# Patient Record
Sex: Female | Born: 1975
Health system: Southern US, Community
[De-identification: ages and names within clinical notes are randomized; demographics above are authoritative.]

## PROBLEM LIST (undated history)

## (undated) DIAGNOSIS — F419 Anxiety disorder, unspecified: Secondary | ICD-10-CM

## (undated) DIAGNOSIS — E785 Hyperlipidemia, unspecified: Secondary | ICD-10-CM

## (undated) DIAGNOSIS — E119 Type 2 diabetes mellitus without complications: Secondary | ICD-10-CM

## (undated) DIAGNOSIS — F32A Depression, unspecified: Secondary | ICD-10-CM

## (undated) DIAGNOSIS — F329 Major depressive disorder, single episode, unspecified: Secondary | ICD-10-CM

## (undated) HISTORY — DX: Hyperlipidemia, unspecified: E78.5

## (undated) HISTORY — DX: Anxiety disorder, unspecified: F41.9

## (undated) HISTORY — PX: ABDOMINAL HYSTERECTOMY: SHX81

## (undated) HISTORY — DX: Depression, unspecified: F32.A

## (undated) HISTORY — PX: CHOLECYSTECTOMY: SHX55

## (undated) HISTORY — DX: Type 2 diabetes mellitus without complications: E11.9

---
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
## 2016-11-06 DIAGNOSIS — F331 Major depressive disorder, recurrent, moderate: Secondary | ICD-10-CM | POA: Insufficient documentation

## 2016-11-06 DIAGNOSIS — E1165 Type 2 diabetes mellitus with hyperglycemia: Secondary | ICD-10-CM | POA: Insufficient documentation

## 2016-11-06 DIAGNOSIS — Z87442 Personal history of urinary calculi: Secondary | ICD-10-CM | POA: Insufficient documentation

## 2018-01-15 DIAGNOSIS — A630 Anogenital (venereal) warts: Secondary | ICD-10-CM | POA: Insufficient documentation

## 2018-04-10 ENCOUNTER — Ambulatory Visit (INDEPENDENT_AMBULATORY_CARE_PROVIDER_SITE_OTHER): Payer: BLUE CROSS/BLUE SHIELD | Admitting: Family Medicine

## 2018-04-10 ENCOUNTER — Other Ambulatory Visit (HOSPITAL_COMMUNITY)
Admission: RE | Admit: 2018-04-10 | Discharge: 2018-04-10 | Disposition: A | Discharge status: Home / Self Care | Payer: BLUE CROSS/BLUE SHIELD | Source: Ambulatory Visit | Attending: Family Medicine | Admitting: Family Medicine

## 2018-04-10 ENCOUNTER — Encounter: Payer: Self-pay | Admitting: Family Medicine

## 2018-04-10 VITALS — BP 109/75 | HR 73 | Temp 98.6°F | Resp 17 | Ht 62.0 in | Wt 180.2 lb

## 2018-04-10 DIAGNOSIS — N898 Other specified noninflammatory disorders of vagina: Secondary | ICD-10-CM | POA: Diagnosis not present

## 2018-04-10 DIAGNOSIS — B9689 Other specified bacterial agents as the cause of diseases classified elsewhere: Secondary | ICD-10-CM | POA: Diagnosis not present

## 2018-04-10 DIAGNOSIS — Z6833 Body mass index (BMI) 33.0-33.9, adult: Secondary | ICD-10-CM

## 2018-04-10 DIAGNOSIS — N760 Acute vaginitis: Secondary | ICD-10-CM | POA: Insufficient documentation

## 2018-04-10 DIAGNOSIS — Z8639 Personal history of other endocrine, nutritional and metabolic disease: Secondary | ICD-10-CM | POA: Diagnosis not present

## 2018-04-10 MED ORDER — FLUCONAZOLE 150 MG PO TABS: 150.0000 mg | ORAL_TABLET | Freq: Once | ORAL | 1 refills | Status: AC

## 2018-04-10 MED ORDER — METRONIDAZOLE 500 MG PO TABS: 500.0000 mg | ORAL_TABLET | Freq: Two times a day (BID) | ORAL | 0 refills | Status: AC

## 2018-04-10 MED FILL — metroNIDAZOLE 500 MG TABS: 500 | 7 days supply | Qty: 14 | Fill #0

## 2018-04-10 MED FILL — FLUCONAZOLE 150 MG TABS: 150 | 2 days supply | Qty: 2 | Fill #0

## 2018-04-10 NOTE — Progress Notes (Signed)
Mary Howard, is a 42 y.o. female  WGN:[id number]  HQI:[id number]  DOB - 06-08-1976  CC:  Chief Complaint  Patient presents with  . Establish Care  . Vaginitis    is diabetic. was on Metformin & Glipizide but was able to d/c & be diet controlled. has noticed over the last 2 years that she's having them more frequently. 3-4 weeks ago she has noticed itching, odor, milky white discharge & irritation       HPI: Seeley is a 42 y.o. female is here today to establish care.   Chronic health problems include:has Hx of diabetes mellitus and BMI 33.0-33.9,adult on their problem list.   Yazlin Ekblad reports a history of type 2 diabetes with oral medication management. Oral diabetes were stopped about 10 months ago as her diabetes was well controlled and she lost a significant amount of weight. Denies polyuria, polydipsia, polyphagia, numbness/tingling of distal extremities. Her concern today is recurrent episodes of vaginitis and yeast infections. He was told recurrences were related to diabetes, although once diabetes improved, vaginal issues continue. She notes vaginal irritation often coincides with her menstrual period. Current symptoms include odor, thick discharge, and white speckle type shapes in discharge. Recently diagnosed with HPV and had once occurrence of vaginal warts. Last unprotected sexual encounter occurred around 3 months ago. Patient denies new headaches, chest pain, abdominal pain, nausea, new weakness , numbness or tingling, SOB, edema, or worrisome cough. .  Current medications: Current Outpatient Medications:  .  metroNIDAZOLE (FLAGYL) 500 MG tablet, Take 1 tablet (500 mg total) by mouth 2 (two) times daily for 7 days., Disp: 14 tablet, Rfl: 0   Pertinent family medical history: family history includes Bipolar disorder in her mother and sister; Cancer in her paternal grandfather, paternal grandmother, and paternal uncle; Diabetes in her mother; Heart disease in her father;  Hyperlipidemia in her father; Hypertension in her mother; Stroke in her father.   No Known Allergies  Social History   Socioeconomic History  . Marital status: Divorced    Spouse name: Not on file  . Number of children: Not on file  . Years of education: Not on file  . Highest education level: Not on file  Occupational History  . Not on file  Social Needs  . Financial resource strain: Not on file  . Food insecurity:    Worry: Not on file    Inability: Not on file  . Transportation needs:    Medical: Not on file    Non-medical: Not on file  Tobacco Use  . Smoking status: Light Tobacco Smoker  . Smokeless tobacco: Never Used  Substance and Sexual Activity  . Alcohol use: Not on file  . Drug use: Not on file  . Sexual activity: Not on file  Lifestyle  . Physical activity:    Days per week: Not on file    Minutes per session: Not on file  . Stress: Not on file  Relationships  . Social connections:    Talks on phone: Not on file    Gets together: Not on file    Attends religious service: Not on file    Active member of club or organization: Not on file    Attends meetings of clubs or organizations: Not on file    Relationship status: Not on file  . Intimate partner violence:    Fear of current or ex partner: Not on file    Emotionally abused: Not on file    Physically abused: Not  on file    Forced sexual activity: Not on file  Other Topics Concern  . Not on file  Social History Narrative  . Not on file   Review of Systems  Constitutional: Negative.   Respiratory: Negative.   Cardiovascular: Negative.   Genitourinary:       Vaginal irritation   Skin: Negative.   Psychiatric/Behavioral: Negative.        Objective:   Vitals:   04/10/18 1003  BP: 109/75  Pulse: 73  Resp: 17  Temp: 98.6 F (37 C)  SpO2: 97%    BP Readings from Last 3 Encounters:  04/10/18 109/75    Filed Weights   04/10/18 1003  Weight: 180 lb 3.2 oz (81.7 kg)      Physical  Exam: Constitutional: Patient appears well-developed and well-nourished. No distress. HENT: Normocephalic, atraumatic, External right and left ear normal.  Eyes: Conjunctivae and EOM are normal. PERRLA, no scleral icterus. Neck: Normal ROM. Neck supple. No JVD. No tracheal deviation. No thyromegaly. CVS: RRR, S1/S2 +, no murmurs, no gallops, no carotid bruit.  Pulmonary: Effort and breath sounds normal, no stridor, rhonchi, wheezes, rales.  Abdominal: Soft. BS +, no distension, tenderness, rebound or guarding.  Musculoskeletal: Normal range of motion. No edema and no tenderness.  Female Genitalia: Normal female external genitalia without lesion. No inguinal lymphadenopathy. Vaginal mucosa is pink and moist without lesions.  Mild odor noted.  Opaque discharge noted.  Vaginal specimen collected. Chaperone present throughtout exam.  Neuro: Alert. Normal reflexes, muscle tone coordination. No cranial nerve deficit. Skin: Skin is warm and dry. No rash noted. Not diaphoretic. No erythema. No pallor. Psychiatric: Normal mood and affect. Behavior, judgment, thought content normal.    Assessment and plan:  1. Vaginal irritation 2. Vaginal odor Vaginal specimen collected.  Genitalia intact.  Negative for satellite lesions.  Exam findings with history is most significant for bacterial vaginosis.  Will treat for both bacterial vaginosis along with Diflucan for yeast, will awaiting lab results. - Cervicovaginal ancillary only -CBC to rule out infection  3. Hx of diabetes mellitus, Obtaining a baseline hemoglobin A1c and fasting CMP  4. BMI 33.0-33.9,adult, will check a thyroid panel to rule out metabolic causes.   Checking a lipid panel to obtain a baseline.   Return in about 1 month (around 05/11/2018) for review labs and , Complete Physical Exam.  The patient was given clear instructions to go to ER or return to medical center if symptoms don't improve, worsen or new problems develop. The patient  verbalized understanding. The patient was told to call to get lab results if they haven't heard anything in the next week.    Joaquin Courts, FNP Primary Care at [facility] [address], [place] [address] [phone]: [phone]    This note has been created with Dragon speech recognition software and Paediatric nurse. Any transcriptional errors are unintentional.

## 2018-04-10 NOTE — Patient Instructions (Addendum)
Review medication list attached and take all medications as prescribed today.  To have labs completed go: [facility]  [address] [place], [place] [address] [phone]  Thank you for choosing Primary Care at [facility] for your medical home!    Mary Howard was seen by Joaquin Courts, FNP today.   Mary Howard's primary care doctor is Bing Neighbors, FNP.   For the best care possible,  you should try to see Joaquin Courts, FNP  whenever you come to clinic.   We look forward to seeing you again soon!  If you have any questions about your visit today,  please call us at   Or feel free to reach your provider via MyChart.

## 2018-04-11 DIAGNOSIS — Z6833 Body mass index (BMI) 33.0-33.9, adult: Secondary | ICD-10-CM | POA: Insufficient documentation

## 2018-04-11 DIAGNOSIS — Z8639 Personal history of other endocrine, nutritional and metabolic disease: Secondary | ICD-10-CM | POA: Insufficient documentation

## 2018-04-11 LAB — CERVICOVAGINAL ANCILLARY ONLY
BACTERIAL VAGINITIS: POSITIVE — AB
Candida vaginitis: NEGATIVE
Chlamydia: NEGATIVE
NEISSERIA GONORRHEA: NEGATIVE
TRICH (WINDOWPATH): NEGATIVE

## 2018-04-14 ENCOUNTER — Telehealth: Payer: Self-pay | Admitting: Family Medicine

## 2018-04-14 NOTE — Telephone Encounter (Signed)
Patient called requesting lab results, please follow up with patient.  °

## 2018-04-14 NOTE — Progress Notes (Signed)
Patient notified of results. Expressed understanding.

## 2018-04-14 NOTE — Telephone Encounter (Signed)
Patient notified of lab results

## 2018-05-05 ENCOUNTER — Telehealth: Payer: Self-pay | Admitting: Family Medicine

## 2018-05-05 NOTE — Telephone Encounter (Signed)
Advise patient that she can take the Diflucan today and at her next follow-up I can recheck her with a wet prep to ensure discharge is negative for infection. If symptoms worsen prior to that time, feel to call in for an appointment to be evaluated.

## 2018-05-05 NOTE — Telephone Encounter (Signed)
Patient came in to the facility requesting for her PCP to give her a call regarding her upcoming appointment. Please f/u

## 2018-05-05 NOTE — Telephone Encounter (Signed)
Voicemail full.

## 2018-05-05 NOTE — Telephone Encounter (Signed)
States that she has completed the Flagyl. Says that the odor has mostly resolved but she still has a faint odor. Her discharge has gotten much better since her appointment but she still has some milky discharge. She didn't take the Diflucan.  Please advise.

## 2018-05-12 ENCOUNTER — Other Ambulatory Visit (HOSPITAL_COMMUNITY)
Admission: RE | Admit: 2018-05-12 | Discharge: 2018-05-12 | Disposition: A | Discharge status: Home / Self Care | Payer: BLUE CROSS/BLUE SHIELD | Source: Ambulatory Visit | Attending: Family Medicine | Admitting: Family Medicine

## 2018-05-12 ENCOUNTER — Ambulatory Visit: Payer: BLUE CROSS/BLUE SHIELD | Admitting: Family Medicine

## 2018-05-12 ENCOUNTER — Encounter: Payer: Self-pay | Admitting: Family Medicine

## 2018-05-12 VITALS — BP 129/87 | HR 89 | Temp 99.8°F | Resp 18 | Ht 62.0 in | Wt 181.0 lb

## 2018-05-12 DIAGNOSIS — Z8639 Personal history of other endocrine, nutritional and metabolic disease: Secondary | ICD-10-CM | POA: Diagnosis not present

## 2018-05-12 DIAGNOSIS — Z1239 Encounter for other screening for malignant neoplasm of breast: Secondary | ICD-10-CM | POA: Diagnosis not present

## 2018-05-12 DIAGNOSIS — Z6833 Body mass index (BMI) 33.0-33.9, adult: Secondary | ICD-10-CM

## 2018-05-12 DIAGNOSIS — N898 Other specified noninflammatory disorders of vagina: Secondary | ICD-10-CM

## 2018-05-12 DIAGNOSIS — Z124 Encounter for screening for malignant neoplasm of cervix: Secondary | ICD-10-CM

## 2018-05-12 DIAGNOSIS — Z0000 Encounter for general adult medical examination without abnormal findings: Secondary | ICD-10-CM

## 2018-05-12 NOTE — Patient Instructions (Addendum)
To schedule your breast exam, please call [facility] at  Phone: [phone]   Health Maintenance, Female Adopting a healthy lifestyle and getting preventive care can go a long way to promote health and wellness. Talk with your health care provider about what schedule of regular examinations is right for you. This is a good chance for you to check in with your provider about disease prevention and staying healthy. In between checkups, there are plenty of things you can do on your own. Experts have done a lot of research about which lifestyle changes and preventive measures are most likely to keep you healthy. Ask your health care provider for more information. Weight and diet Eat a healthy diet  Be sure to include plenty of vegetables, fruits, low-fat dairy products, and lean protein.  Do not eat a lot of foods high in solid fats, added sugars, or salt.  Get regular exercise. This is one of the most important things you can do for your health. ? Most adults should exercise for at least 150 minutes each week. The exercise should increase your heart rate and make you sweat (moderate-intensity exercise). ? Most adults should also do strengthening exercises at least twice a week. This is in addition to the moderate-intensity exercise.  Maintain a healthy weight  Body mass index (BMI) is a measurement that can be used to identify possible weight problems. It estimates body fat based on height and weight. Your health care provider can help determine your BMI and help you achieve or maintain a healthy weight.  For females 64 years of age and older: ? A BMI below 18.5 is considered underweight. ? A BMI of 18.5 to 24.9 is normal. ? A BMI of 25 to 29.9 is considered overweight. ? A BMI of 30 and above is considered obese.  Watch levels of cholesterol and blood lipids  You should start having your blood tested for lipids and cholesterol at 40 years of age, then have this test every 5  years.  You may need to have your cholesterol levels checked more often if: ? Your lipid or cholesterol levels are high. ? You are older than 40 years of age. ? You are at high risk for heart disease.  Cancer screening Lung Cancer  Lung cancer screening is recommended for adults 9-77 years old who are at high risk for lung cancer because of a history of smoking.  A yearly low-dose CT scan of the lungs is recommended for people who: ? Currently smoke. ? Have quit within the past 15 years. ? Have at least a 30-pack-year history of smoking. A pack year is smoking an average of one pack of cigarettes a day for 1 year.  Yearly screening should continue until it has been 15 years since you quit.  Yearly screening should stop if you develop a health problem that would prevent you from having lung cancer treatment.  Breast Cancer  Practice breast self-awareness. This means understanding how your breasts normally appear and feel.  It also means doing regular breast self-exams. Let your health care provider know about any changes, no matter how small.  If you are in your 20s or 30s, you should have a clinical breast exam (CBE) by a health care provider every 1-3 years as part of a regular health exam.  If you are 41 or older, have a CBE every year. Also consider having a breast X-ray (mammogram) every year.  If you have a family history of breast cancer,  talk to your health care provider about genetic screening.  If you are at high risk for breast cancer, talk to your health care provider about having an MRI and a mammogram every year.  Breast cancer gene (BRCA) assessment is recommended for women who have family members with BRCA-related cancers. BRCA-related cancers include: ? Breast. ? Ovarian. ? Tubal. ? Peritoneal cancers.  Results of the assessment will determine the need for genetic counseling and BRCA1 and BRCA2 testing.  Cervical Cancer Your health care provider may  recommend that you be screened regularly for cancer of the pelvic organs (ovaries, uterus, and vagina). This screening involves a pelvic examination, including checking for microscopic changes to the surface of your cervix (Pap test). You may be encouraged to have this screening done every 3 years, beginning at age 43.  For women ages 5-65, health care providers may recommend pelvic exams and Pap testing every 3 years, or they may recommend the Pap and pelvic exam, combined with testing for human papilloma virus (HPV), every 5 years. Some types of HPV increase your risk of cervical cancer. Testing for HPV may also be done on women of any age with unclear Pap test results.  Other health care providers may not recommend any screening for nonpregnant women who are considered low risk for pelvic cancer and who do not have symptoms. Ask your health care provider if a screening pelvic exam is right for you.  If you have had past treatment for cervical cancer or a condition that could lead to cancer, you need Pap tests and screening for cancer for at least 20 years after your treatment. If Pap tests have been discontinued, your risk factors (such as having a new sexual partner) need to be reassessed to determine if screening should resume. Some women have medical problems that increase the chance of getting cervical cancer. In these cases, your health care provider may recommend more frequent screening and Pap tests.  Colorectal Cancer  This type of cancer can be detected and often prevented.  Routine colorectal cancer screening usually begins at 40 years of age and continues through 40 years of age.  Your health care provider may recommend screening at an earlier age if you have risk factors for colon cancer.  Your health care provider may also recommend using home test kits to check for hidden blood in the stool.  A small camera at the end of a tube can be used to examine your colon directly  (sigmoidoscopy or colonoscopy). This is done to check for the earliest forms of colorectal cancer.  Routine screening usually begins at age 61.  Direct examination of the colon should be repeated every 5-10 years through 40 years of age. However, you may need to be screened more often if early forms of precancerous polyps or small growths are found.  Skin Cancer  Check your skin from head to toe regularly.  Tell your health care provider about any new moles or changes in moles, especially if there is a change in a mole's shape or color.  Also tell your health care provider if you have a mole that is larger than the size of a pencil eraser.  Always use sunscreen. Apply sunscreen liberally and repeatedly throughout the day.  Protect yourself by wearing long sleeves, pants, a wide-brimmed hat, and sunglasses whenever you are outside.  Heart disease, diabetes, and high blood pressure  High blood pressure causes heart disease and increases the risk of stroke. High blood pressure is  more likely to develop in: ? People who have blood pressure in the high end of the normal range (130-139/85-89 mm Hg). ? People who are overweight or obese. ? People who are African American.  If you are 1-44 years of age, have your blood pressure checked every 3-5 years. If you are 35 years of age or older, have your blood pressure checked every year. You should have your blood pressure measured twice-once when you are at a hospital or clinic, and once when you are not at a hospital or clinic. Record the average of the two measurements. To check your blood pressure when you are not at a hospital or clinic, you can use: ? An automated blood pressure machine at a pharmacy. ? A home blood pressure monitor.  If you are between 71 years and 69 years old, ask your health care provider if you should take aspirin to prevent strokes.  Have regular diabetes screenings. This involves taking a blood sample to check your  fasting blood sugar level. ? If you are at a normal weight and have a low risk for diabetes, have this test once every three years after 40 years of age. ? If you are overweight and have a high risk for diabetes, consider being tested at a younger age or more often. Preventing infection Hepatitis B  If you have a higher risk for hepatitis B, you should be screened for this virus. You are considered at high risk for hepatitis B if: ? You were born in a country where hepatitis B is common. Ask your health care provider which countries are considered high risk. ? Your parents were born in a high-risk country, and you have not been immunized against hepatitis B (hepatitis B vaccine). ? You have HIV or AIDS. ? You use needles to inject street drugs. ? You live with someone who has hepatitis B. ? You have had sex with someone who has hepatitis B. ? You get hemodialysis treatment. ? You take certain medicines for conditions, including cancer, organ transplantation, and autoimmune conditions.  Hepatitis C  Blood testing is recommended for: ? Everyone born from [birth year] through 1963. ? Anyone with known risk factors for hepatitis C.  Sexually transmitted infections (STIs)  You should be screened for sexually transmitted infections (STIs) including gonorrhea and chlamydia if: ? You are sexually active and are younger than 40 years of age. ? You are older than 40 years of age and your health care provider tells you that you are at risk for this type of infection. ? Your sexual activity has changed since you were last screened and you are at an increased risk for chlamydia or gonorrhea. Ask your health care provider if you are at risk.  If you do not have HIV, but are at risk, it may be recommended that you take a prescription medicine daily to prevent HIV infection. This is called pre-exposure prophylaxis (PrEP). You are considered at risk if: ? You are sexually active and do not regularly use condoms  or know the HIV status of your partner(s). ? You take drugs by injection. ? You are sexually active with a partner who has HIV.  Talk with your health care provider about whether you are at high risk of being infected with HIV. If you choose to begin PrEP, you should first be tested for HIV. You should then be tested every 3 months for as long as you are taking PrEP. Pregnancy  If you are premenopausal and you  may become pregnant, ask your health care provider about preconception counseling.  If you may become pregnant, take 400 to 800 micrograms (mcg) of folic acid every day.  If you want to prevent pregnancy, talk to your health care provider about birth control (contraception). Osteoporosis and menopause  Osteoporosis is a disease in which the bones lose minerals and strength with aging. This can result in serious bone fractures. Your risk for osteoporosis can be identified using a bone density scan.  If you are 10 years of age or older, or if you are at risk for osteoporosis and fractures, ask your health care provider if you should be screened.  Ask your health care provider whether you should take a calcium or vitamin D supplement to lower your risk for osteoporosis.  Menopause may have certain physical symptoms and risks.  Hormone replacement therapy may reduce some of these symptoms and risks. Talk to your health care provider about whether hormone replacement therapy is right for you. Follow these instructions at home:  Schedule regular health, dental, and eye exams.  Stay current with your immunizations.  Do not use any tobacco products including cigarettes, chewing tobacco, or electronic cigarettes.  If you are pregnant, do not drink alcohol.  If you are breastfeeding, limit how much and how often you drink alcohol.  Limit alcohol intake to no more than 1 drink per day for nonpregnant women. One drink equals 12 ounces of beer, 5 ounces of wine, or 1 ounces of hard  liquor.  Do not use street drugs.  Do not share needles.  Ask your health care provider for help if you need support or information about quitting drugs.  Tell your health care provider if you often feel depressed.  Tell your health care provider if you have ever been abused or do not feel safe at home. This information is not intended to replace advice given to you by your health care provider. Make sure you discuss any questions you have with your health care provider. Document Released: 01/01/2011 Document Revised: 11/24/2015 Document Reviewed: 03/22/2015 Elsevier Interactive Patient Education  [employer].

## 2018-05-12 NOTE — Progress Notes (Signed)
Subjective:  Mary Howard is a 42 y.o. female who presents for complete physical exam.   Health Concerns: Weight gain and Vaginal Discharge   Physical activity: recently signed up with personal trainer. Body mass index is 33.11 kg/m.  Diet changes: Eats 3 meals per day, reduced fat and low sodium.  Has family support to encourage healthy diet.  Medical History: -Had eye exam within 6 months. No evidence of retinopathy - Diagnosed with diabetes, renal issues, elevated BP over 2 years ago -History of renal stones   -Stopped diabetes medication over 1 year ago at the direction of her last PCP. -Off Hypertension medication for  1 year ago, no routine med  Social history: Current occasionally smoker (1-2 cigarettes) and with drinking maybe 2-3 cigarettes. Quit previously, now smoke socially. Drinks socially at least once per month.  GYN history: No prior mammograms Unknown last PAP Hysterectomy   Family Medical: Sister at age 55 dx with schizophrenia and bipolar. Diabetes-both parents side of family Cardiovascular disease- CVA and CAD   Declined influenza vaccines. Past Medical History:  Diagnosis Date  . Diabetes mellitus without complication (HCC)     Social History   Tobacco Use  . Smoking status: Light Tobacco Smoker  . Smokeless tobacco: Never Used  Substance Use Topics  . Alcohol use: Not on file  . Drug use: Not on file    No Known Allergies  No current outpatient medications on file.   No current facility-administered medications for this visit.     ROS  All systems negative with the exception of those noted in HPI   BP 129/87 (BP Location: Left Arm, Patient Position: Sitting, Cuff Size: Normal)   Pulse 89   Temp 99.8 F (37.7 C) (Oral)   Resp 18   Ht 5\' 2"  (1.575 m)   Wt 181 lb (82.1 kg)   SpO2 97%   BMI 33.11 kg/m   Objective:  Physical Exam: Constitutional: Patient appears well-developed and well-nourished. No distress. HENT:  Normocephalic, atraumatic, External right and left ear normal. Oropharynx is clear and moist.  Eyes: Conjunctivae and EOM are normal. PERRLA, no scleral icterus. Neck: Normal ROM. Neck supple. No JVD. No tracheal deviation. No thyromegaly. CVS: RRR, S1/S2 +, no murmurs. Pulmonary: Effort and breath sounds normal, no stridor, rhonchi, wheezes, rales.  Abdominal: Soft. BS +, no distension, tenderness, rebound or guarding.  Musculoskeletal: Normal range of motion. No edema and no tenderness.  Lymphadenopathy: No lymphadenopathy cervical. Neuro: Alert and muscle tone coordination.  Skin: Skin is warm and dry. No rash noted. Not diaphoretic. No erythema. No pallor. Psychiatric: Normal mood and affect. Behavior, judgment, thought content normal.  Assessment and Plan:  1. Breast cancer screening - MM Digital Screening; Future  2. BMI 33.0-33.9,adult Encouraged efforts to reduce weight include engaging in physical activity as tolerated with goal of 150 minutes per week. Improve dietary choices and eat a meal regimen consistent with a Mediterranean or DASH diet. Reduce simple carbohydrates. Do not skip meals and eat healthy snacks throughout the day to avoid over-eating at dinner. Set a goal weight loss that is achievable for you. - Lipid panel  3. Hx of diabetes mellitus - Lipid panel - Hemoglobin A1c  4. Annual physical exam -Anticipatory guidance provided  - Urinalysis  5. Pap smear for cervical cancer screening - Cytology - PAP([facility]) - Urinalysis   6. Vaginal irritation 7. Vaginal odor -Rescreening for Bacterial and Candidiasis     Orders Placed This Encounter  Procedures  .  MM Digital Screening    Standing Status:   Future    Standing Expiration Date:   07/13/2019    Order Specific Question:   Reason for Exam (SYMPTOM  OR DIAGNOSIS REQUIRED)    Answer:   mammogram screening    Order Specific Question:   Is the patient pregnant?    Answer:   No    Order Specific  Question:   Preferred imaging location?    Answer:   [facility]  . Urinalysis  . Hemoglobin A1c  . Thyroid Panel With TSH  . Comprehensive metabolic panel  . CBC with Differential   Patient will follow up with PCP.

## 2018-05-13 LAB — CBC WITH DIFFERENTIAL/PLATELET
BASOS: 0 %
Basophils Absolute: 0 10*3/uL (ref 0.0–0.2)
EOS (ABSOLUTE): 0.1 10*3/uL (ref 0.0–0.4)
EOS: 1 %
HEMATOCRIT: 42.3 % (ref 34.0–46.6)
Hemoglobin: 14.4 g/dL (ref 11.1–15.9)
Immature Grans (Abs): 0 10*3/uL (ref 0.0–0.1)
Immature Granulocytes: 0 %
LYMPHS: 35 %
Lymphocytes Absolute: 3.2 10*3/uL — ABNORMAL HIGH (ref 0.7–3.1)
MCH: 29.9 pg (ref 26.6–33.0)
MCHC: 34 g/dL (ref 31.5–35.7)
MCV: 88 fL (ref 79–97)
Monocytes Absolute: 0.6 10*3/uL (ref 0.1–0.9)
Monocytes: 6 %
NEUTROS ABS: 5.1 10*3/uL (ref 1.4–7.0)
Neutrophils: 58 %
PLATELETS: 317 10*3/uL (ref 150–450)
RBC: 4.81 x10E6/uL (ref 3.77–5.28)
RDW: 13 % (ref 12.3–15.4)
WBC: 9 10*3/uL (ref 3.4–10.8)

## 2018-05-13 LAB — COMPREHENSIVE METABOLIC PANEL
ALT: 19 IU/L (ref 0–32)
AST: 16 IU/L (ref 0–40)
Albumin/Globulin Ratio: 1.5 (ref 1.2–2.2)
Albumin: 4.5 g/dL (ref 3.5–5.5)
Alkaline Phosphatase: 78 IU/L (ref 39–117)
BUN/Creatinine Ratio: 19 (ref 9–23)
BUN: 12 mg/dL (ref 6–24)
Bilirubin Total: 0.2 mg/dL (ref 0.0–1.2)
CO2: 24 mmol/L (ref 20–29)
Calcium: 9.4 mg/dL (ref 8.7–10.2)
Chloride: 104 mmol/L (ref 96–106)
Creatinine, Ser: 0.63 mg/dL (ref 0.57–1.00)
GFR, EST AFRICAN AMERICAN: 130 mL/min/{1.73_m2} (ref >59)
GFR, EST NON AFRICAN AMERICAN: 113 mL/min/{1.73_m2} (ref >59)
GLOBULIN, TOTAL: 3 g/dL (ref 1.5–4.5)
Glucose: 143 mg/dL — ABNORMAL HIGH (ref 65–99)
POTASSIUM: 4.3 mmol/L (ref 3.5–5.2)
SODIUM: 141 mmol/L (ref 134–144)
TOTAL PROTEIN: 7.5 g/dL (ref 6.0–8.5)

## 2018-05-13 LAB — THYROID PANEL WITH TSH
FREE THYROXINE INDEX: 1.7 (ref 1.2–4.9)
T3 Uptake Ratio: 24 % (ref 24–39)
T4, Total: 7 ug/dL (ref 4.5–12.0)
TSH: 2.86 u[IU]/mL (ref 0.450–4.500)

## 2018-05-13 LAB — HEMOGLOBIN A1C
ESTIMATED AVERAGE GLUCOSE: 189 mg/dL
HEMOGLOBIN A1C: 8.2 % — AB (ref 4.8–5.6)

## 2018-05-13 LAB — LIPID PANEL
Chol/HDL Ratio: 3.9 ratio (ref 0.0–4.4)
Cholesterol, Total: 193 mg/dL (ref 100–199)
HDL: 49 mg/dL (ref >39)
LDL CALC: 110 mg/dL — AB (ref 0–99)
Triglycerides: 169 mg/dL — ABNORMAL HIGH (ref 0–149)
VLDL Cholesterol Cal: 34 mg/dL (ref 5–40)

## 2018-05-14 ENCOUNTER — Other Ambulatory Visit: Payer: Self-pay | Admitting: Family Medicine

## 2018-05-14 ENCOUNTER — Telehealth: Payer: Self-pay | Admitting: *Deleted

## 2018-05-14 DIAGNOSIS — Z1231 Encounter for screening mammogram for malignant neoplasm of breast: Secondary | ICD-10-CM

## 2018-05-14 LAB — CYTOLOGY - PAP
Bacterial vaginitis: NEGATIVE
Candida vaginitis: POSITIVE — AB
Chlamydia: NEGATIVE
Diagnosis: NEGATIVE
Neisseria Gonorrhea: NEGATIVE
TRICH (WINDOWPATH): NEGATIVE

## 2018-05-14 MED ORDER — ATORVASTATIN CALCIUM 20 MG PO TABS: 20.0000 mg | ORAL_TABLET | Freq: Every day | ORAL | 3 refills | Status: DC

## 2018-05-14 MED ORDER — METFORMIN HCL 500 MG PO TABS: 500.0000 mg | ORAL_TABLET | Freq: Two times a day (BID) | ORAL | 3 refills | Status: DC

## 2018-05-14 MED ORDER — FLUCONAZOLE 150 MG PO TABS: 150.0000 mg | ORAL_TABLET | Freq: Once | ORAL | 0 refills | Status: AC

## 2018-05-14 MED FILL — metFORMIN HCL 500 MG TABS: 500 | 30 days supply | Qty: 60 | Fill #0

## 2018-05-14 MED FILL — ATORVASTATIN CALCIUM 20 MG: 20 | 30 days supply | Qty: 30 | Fill #0

## 2018-05-14 NOTE — Addendum Note (Signed)
Addended by: Bing NeighborsHARRIS, Rien Marland S on: 05/14/2018 06:57 PM   Modules accepted: Orders

## 2018-05-14 NOTE — Addendum Note (Signed)
Addended by: Bing NeighborsHARRIS, Trista Ciocca S on: 05/14/2018 12:59 PM   Modules accepted: Orders

## 2018-05-14 NOTE — Telephone Encounter (Signed)
Patient verified DOB Patient is aware of atorvastatin and [facility] for patient to resume to address the 8.6 A1C level and the elevated cholesterol. Patient is aware of lipid and A1C being rechecked in 3 months. Patient is aware of receiving another phone call regarding the STI results. Please schedule patient for 3 month FU when STI results are shared.

## 2018-05-15 MED FILL — FLUCONAZOLE 150 MG TABS: 150 | 2 days supply | Qty: 2 | Fill #0

## 2018-05-19 ENCOUNTER — Telehealth: Payer: Self-pay | Admitting: Family Medicine

## 2018-05-19 LAB — URINALYSIS, ROUTINE W REFLEX MICROSCOPIC
Bilirubin, UA: NEGATIVE
GLUCOSE, UA: NEGATIVE
Ketones, UA: NEGATIVE
Leukocytes, UA: NEGATIVE
Nitrite, UA: POSITIVE — AB
Protein, UA: NEGATIVE
RBC, UA: NEGATIVE
Specific Gravity, UA: 1.024 (ref 1.005–1.030)
Urobilinogen, Ur: 0.2 mg/dL (ref 0.2–1.0)
pH, UA: 5 (ref 5.0–7.5)

## 2018-05-19 LAB — MICROSCOPIC EXAMINATION
Casts: NONE SEEN /lpf
Epithelial Cells (non renal): NONE SEEN /hpf (ref 0–10)

## 2018-05-19 LAB — SPECIMEN STATUS REPORT

## 2018-05-19 NOTE — Telephone Encounter (Signed)
Patient urine specimen indicated possible urinary infection or specimen collection contamination. Please follow-up to inquire if patient is experiencing dysuria, fever, chills, flank pain, nausea, or vomiting. I will can send over a Keflex 500 mg BID x  Days if she is symptomatic with UTI infection or she can return to submit urine for culture.

## 2018-05-21 MED ORDER — CEPHALEXIN 500 MG PO CAPS: 500.0000 mg | ORAL_CAPSULE | Freq: Two times a day (BID) | ORAL | 0 refills | Status: AC

## 2018-05-21 NOTE — Addendum Note (Signed)
Addended by: Bing NeighborsHARRIS, Dacen Frayre S on: 05/21/2018 06:01 PM   Modules accepted: Orders

## 2018-05-21 NOTE — Telephone Encounter (Signed)
Keflex sent for UTI symptoms to [facility]. Request that she returns in 2.5 weeks for repeat UA

## 2018-05-21 NOTE — Progress Notes (Signed)
Patient notified of results & recommendations. Expressed understanding.

## 2018-05-21 NOTE — Telephone Encounter (Signed)
Patient notified of urine results. She states that she is having some frequency, odor & pain.

## 2018-05-22 MED FILL — CEPHALEXIN 500 MG CAPSULE: 500 | 10 days supply | Qty: 20 | Fill #0

## 2018-06-30 ENCOUNTER — Ambulatory Visit
Admission: RE | Admit: 2018-06-30 | Discharge: 2018-06-30 | Disposition: A | Discharge status: Home / Self Care | Payer: BLUE CROSS/BLUE SHIELD | Source: Ambulatory Visit | Attending: Family Medicine | Admitting: Family Medicine

## 2018-06-30 DIAGNOSIS — Z1231 Encounter for screening mammogram for malignant neoplasm of breast: Secondary | ICD-10-CM | POA: Diagnosis not present

## 2018-08-20 ENCOUNTER — Ambulatory Visit
Admission: EM | Admit: 2018-08-20 | Discharge: 2018-08-20 | Disposition: A | Discharge status: Home / Self Care | Payer: BLUE CROSS/BLUE SHIELD | Attending: Family Medicine | Admitting: Family Medicine

## 2018-08-20 DIAGNOSIS — R05 Cough: Secondary | ICD-10-CM

## 2018-08-20 DIAGNOSIS — R0981 Nasal congestion: Secondary | ICD-10-CM | POA: Diagnosis not present

## 2018-08-20 DIAGNOSIS — R69 Illness, unspecified: Principal | ICD-10-CM

## 2018-08-20 DIAGNOSIS — J111 Influenza due to unidentified influenza virus with other respiratory manifestations: Secondary | ICD-10-CM

## 2018-08-20 MED ORDER — HYDROCODONE-HOMATROPINE 5-1.5 MG/5ML PO SYRP: 5.0000 mL | ORAL_SOLUTION | Freq: Four times a day (QID) | ORAL | 0 refills | Status: DC | PRN

## 2018-08-20 MED ORDER — OSELTAMIVIR PHOSPHATE 75 MG PO CAPS: 75.0000 mg | ORAL_CAPSULE | Freq: Two times a day (BID) | ORAL | 0 refills | Status: AC

## 2018-08-20 NOTE — Discharge Instructions (Signed)

## 2018-08-20 NOTE — ED Provider Notes (Signed)
[facility]   [id number] 08/20/18 Arrival Time: 1018  ASSESSMENT & PLAN:  1. Influenza-like illness    See AVS for discharge instructions.  Meds ordered this encounter  Medications  . oseltamivir (TAMIFLU) 75 MG capsule    Sig: Take 1 capsule (75 mg total) by mouth 2 (two) times daily for 5 days.    Dispense:  10 capsule    Refill:  0  . HYDROcodone-homatropine (HYCODAN) 5-1.5 MG/5ML syrup    Sig: Take 5 mLs by mouth every 6 (six) hours as needed for cough.    Dispense:  90 mL    Refill:  0   Work note provided. Cough medication sedation precautions. Discussed typical duration of symptoms. OTC symptom care as needed. Ensure adequate fluid intake and rest. May f/u with PCP or here as needed.  Reviewed expectations re: course of current medical issues. Questions answered. Outlined signs and symptoms indicating need for more acute intervention. Patient verbalized understanding. After Visit Summary given.   SUBJECTIVE: History from: patient.  Mary Howard is a 43 y.o. female who presents with complaint of nasal congestion, post-nasal drainage, and a persistent dry cough; without sore throat. Onset abrupt, yesterday; with fatigue and without body aches. SOB: none. Wheezing: none. Fever: yes, subjective with chills. Overall normal PO intake without n/v. Known sick contacts: yes, sister influenza +. No specific or significant aggravating or alleviating factors reported. OTC treatment: none reported.   Social History   Tobacco Use  Smoking Status Light Tobacco Smoker  Smokeless Tobacco Never Used    ROS: As per HPI.   OBJECTIVE:  Vitals:   08/20/18 1056  BP: 130/82  Pulse: (!) 101  Resp: 16  Temp: 99.1 F (37.3 C)  TempSrc: Oral  SpO2: 98%    Recheck pulse: 96  General appearance: alert; appears fatigued HEENT: nasal congestion; clear runny nose; throat irritation secondary to post-nasal drainage Neck: supple without LAD CV: RRR Lungs:  unlabored respirations, symmetrical air entry without wheezing; cough: moderate Abd: soft Ext: no LE edema Skin: warm and dry Psychological: alert and cooperative; normal mood and affect   No Known Allergies  Past Medical History:  Diagnosis Date  . Diabetes mellitus without complication (HCC)    Family History  Problem Relation Age of Onset  . Diabetes Mother   . Bipolar disorder Mother   . Hypertension Mother   . Hyperlipidemia Father   . Heart disease Father   . Stroke Father   . Cancer Paternal Uncle   . Cancer Paternal Grandmother   . Cancer Paternal Grandfather   . Bipolar disorder Sister    Social History   Socioeconomic History  . Marital status: Divorced    Spouse name: Not on file  . Number of children: Not on file  . Years of education: Not on file  . Highest education level: Not on file  Occupational History  . Not on file  Social Needs  . Financial resource strain: Not on file  . Food insecurity:    Worry: Not on file    Inability: Not on file  . Transportation needs:    Medical: Not on file    Non-medical: Not on file  Tobacco Use  . Smoking status: Light Tobacco Smoker  . Smokeless tobacco: Never Used  Substance and Sexual Activity  . Alcohol use: Not on file  . Drug use: Not on file  . Sexual activity: Not on file  Lifestyle  . Physical activity:    Days per  week: Not on file    Minutes per session: Not on file  . Stress: Not on file  Relationships  . Social connections:    Talks on phone: Not on file    Gets together: Not on file    Attends religious service: Not on file    Active member of club or organization: Not on file    Attends meetings of clubs or organizations: Not on file    Relationship status: Not on file  . Intimate partner violence:    Fear of current or ex partner: Not on file    Emotionally abused: Not on file    Physically abused: Not on file    Forced sexual activity: Not on file  Other Topics Concern  . Not on  file  Social History Narrative  . Not on file           Mardella Layman, MD 08/20/18 1241

## 2018-08-20 NOTE — ED Triage Notes (Addendum)
Pt c/o cold/flu like sx onset yest associated w/dyspnea, chest tightness, dry cough, dizziness, decreased appetite, nauseas  Sts sister tested positive for flu  Denies fevers, vomiting, diarrhea  Took motrin this am around 0700  A&O x4... NAD.Marland Kitchen. ambulatory   Hale Drone, RN

## 2018-08-25 ENCOUNTER — Ambulatory Visit (INDEPENDENT_AMBULATORY_CARE_PROVIDER_SITE_OTHER): Payer: BLUE CROSS/BLUE SHIELD

## 2018-08-25 ENCOUNTER — Ambulatory Visit
Admission: EM | Admit: 2018-08-25 | Discharge: 2018-08-25 | Disposition: A | Discharge status: Home / Self Care | Payer: BLUE CROSS/BLUE SHIELD | Attending: Nurse Practitioner | Admitting: Nurse Practitioner

## 2018-08-25 ENCOUNTER — Encounter: Payer: Self-pay | Admitting: Emergency Medicine

## 2018-08-25 DIAGNOSIS — R0602 Shortness of breath: Secondary | ICD-10-CM

## 2018-08-25 DIAGNOSIS — J209 Acute bronchitis, unspecified: Secondary | ICD-10-CM | POA: Diagnosis not present

## 2018-08-25 DIAGNOSIS — R05 Cough: Secondary | ICD-10-CM | POA: Diagnosis not present

## 2018-08-25 MED ORDER — PREDNISONE 20 MG PO TABS: 40.0000 mg | ORAL_TABLET | Freq: Every day | ORAL | 0 refills | Status: AC

## 2018-08-25 MED ORDER — AMOXICILLIN-POT CLAVULANATE 875-125 MG PO TABS: 1.0000 | ORAL_TABLET | Freq: Two times a day (BID) | ORAL | 0 refills | Status: DC

## 2018-08-25 MED ORDER — DM-GUAIFENESIN ER 30-600 MG PO TB12: 1.0000 | ORAL_TABLET | Freq: Two times a day (BID) | ORAL | 0 refills | Status: AC

## 2018-08-25 MED ORDER — ALBUTEROL SULFATE HFA 108 (90 BASE) MCG/ACT IN AERS: 1.0000 | INHALATION_SPRAY | Freq: Four times a day (QID) | RESPIRATORY_TRACT | 0 refills | Status: DC | PRN

## 2018-08-25 MED ORDER — HYDROCODONE-HOMATROPINE 5-1.5 MG/5ML PO SYRP: 5.0000 mL | ORAL_SOLUTION | Freq: Four times a day (QID) | ORAL | 0 refills | Status: DC | PRN

## 2018-08-25 NOTE — ED Triage Notes (Signed)
Pt presents to [facility] for assessment of worsening cough after being diagnosed with the flu last week.  States cough is now productive, green in color.  Unable to sleep well at night due to the cough.  Denies known fevers.

## 2018-08-25 NOTE — ED Notes (Signed)
Patient able to ambulate independently  

## 2018-08-25 NOTE — Discharge Instructions (Addendum)
Take medications as prescribed.  Continue your Hycodan that was prescribed to you during your previous visit for cough.  Drink plenty of fluids.  Follow-up with your primary care provider in 1 week if you are no better.  Go to the ED if you experience chest pain, shortness of breath or any other concerning symptoms.

## 2018-08-25 NOTE — ED Provider Notes (Signed)
[facility]    CSN: [id number] Arrival date & time: 08/25/18  1524     History   Chief Complaint Chief Complaint  Patient presents with  . Cough    HPI Mary Howard is a 43 y.o. female.   Subjective:   Mary Howard is a 43 y.o. female here for evaluation of a cough.  Patient was diagnosed with influenza-like illness on 08/20/2018.  Completed a course of Tamiflu.  Patient stated her cough started a couple days after being diagnosed with the flu.  All of her other symptoms have since resolved.  Cough remains and has worsened over the past 3 to 4 days. The cough is productive of green/yellow sputum, with shortness of breath during the cough, chest is painful during coughing and is aggravated by nothing. Patient does not have a history of asthma. Patient has not had recent travel. Patient does have a history of smoking but recently quit in January 2020.   The following portions of the patient's history were reviewed and updated as appropriate: allergies, current medications, past family history, past medical history, past social history, past surgical history and problem list.       Past Medical History:  Diagnosis Date  . Diabetes mellitus without complication [facility])     Patient Active Problem List   Diagnosis Date Noted  . Hx of diabetes mellitus 04/11/2018  . BMI 33.0-33.9,adult 04/11/2018    Past Surgical History:  Procedure Laterality Date  . ABDOMINAL HYSTERECTOMY    . CHOLECYSTECTOMY      OB History   No obstetric history on file.      Home Medications    Prior to Admission medications   Medication Sig Start Date End Date Taking? Authorizing Provider  atorvastatin (LIPITOR) 20 MG tablet Take 1 tablet (20 mg total) by mouth daily. 05/14/18  Yes Bing Neighbors, FNP  HYDROcodone-homatropine [facility]) 5-1.5 MG/5ML syrup Take 5 mLs by mouth every 6 (six) hours as needed for cough. 08/20/18  Yes Mardella Layman, MD  metFORMIN (GLUCOPHAGE) 500 MG  tablet Take 1 tablet (500 mg total) by mouth 2 (two) times daily with a meal. 05/14/18  Yes Bing Neighbors, FNP  oseltamivir (TAMIFLU) 75 MG capsule Take 1 capsule (75 mg total) by mouth 2 (two) times daily for 5 days. 08/20/18 08/25/18 Yes Hagler, Arlys John, MD  albuterol (PROVENTIL HFA;VENTOLIN HFA) 108 (90 Base) MCG/ACT inhaler Inhale 1-2 puffs into the lungs every 6 (six) hours as needed for wheezing or shortness of breath. 08/25/18   Lurline Idol, FNP  amoxicillin-clavulanate (AUGMENTIN) 875-125 MG tablet Take 1 tablet by mouth every 12 (twelve) hours. 08/25/18   Lurline Idol, FNP  dextromethorphan-guaiFENesin [facility]) 30-600 MG 12hr tablet Take 1 tablet by mouth 2 (two) times daily for 7 days. 08/25/18 09/01/18  Lurline Idol, FNP  predniSONE (DELTASONE) 20 MG tablet Take 2 tablets (40 mg total) by mouth daily for 5 days. 08/25/18 08/30/18  Lurline Idol, FNP    Family History Family History  Problem Relation Age of Onset  . Diabetes Mother   . Bipolar disorder Mother   . Hypertension Mother   . Hyperlipidemia Father   . Heart disease Father   . Stroke Father   . Cancer Paternal Uncle   . Cancer Paternal Grandmother   . Cancer Paternal Grandfather   . Bipolar disorder Sister     Social History Social History   Tobacco Use  . Smoking status: Current Some Day Smoker  . Smokeless tobacco:  Never Used  Substance Use Topics  . Alcohol use: Not Currently  . Drug use: Never     Allergies   Patient has no known allergies.   Review of Systems Review of Systems  Constitutional: Negative.   HENT: Negative.   Respiratory: Positive for cough, chest tightness and shortness of breath. Negative for wheezing.   Neurological: Negative.   All other systems reviewed and are negative.    Physical Exam Triage Vital Signs ED Triage Vitals  Enc Vitals Group     BP 08/25/18 1529 133/83     Pulse Rate 08/25/18 1529 72     Resp 08/25/18 1529 18     Temp 08/25/18 1529  98.3 F (36.8 C)     Temp src --      SpO2 08/25/18 1529 97 %     Weight --      Height --      Head Circumference --      Peak Flow --      Pain Score 08/25/18 1530 5     Pain Loc --      Pain Edu? --      Excl. in GC? --    No data found.  Updated Vital Signs BP 133/83 (BP Location: Right Arm)   Pulse 72   Temp 98.3 F (36.8 C) Comment: motrin between 12 and 1pm  Resp 18   SpO2 97%   Visual Acuity Right Eye Distance:   Left Eye Distance:   Bilateral Distance:    Right Eye Near:   Left Eye Near:    Bilateral Near:     Physical Exam Vitals signs reviewed.  Constitutional:      General: She is not in acute distress.    Appearance: Normal appearance. She is not ill-appearing, toxic-appearing or diaphoretic.  HENT:     Head: Normocephalic.     Nose: Nose normal.  Neck:     Musculoskeletal: Normal range of motion and neck supple.  Cardiovascular:     Rate and Rhythm: Normal rate and regular rhythm.  Pulmonary:     Effort: Pulmonary effort is normal.     Breath sounds: Normal breath sounds.  Musculoskeletal: Normal range of motion.  Lymphadenopathy:     Cervical: No cervical adenopathy.  Skin:    General: Skin is warm and dry.  Neurological:     General: No focal deficit present.     Mental Status: She is alert and oriented to person, place, and time.  Psychiatric:        Mood and Affect: Mood normal.        Behavior: Behavior normal.        Thought Content: Thought content normal.        Judgment: Judgment normal.      UC Treatments / Results  Labs (all labs ordered are listed, but only abnormal results are displayed) Labs Reviewed - No data to display  EKG None  Radiology Dg Chest 2 View  Result Date: 08/25/2018 CLINICAL DATA:  Initial evaluation for acute cough, flu. EXAM: CHEST - 2 VIEW COMPARISON:  None. FINDINGS: The cardiac and mediastinal silhouettes are stable in size and contour, and remain within normal limits. The lungs are normally  inflated. Mild diffuse peribronchial thickening, most notable with right lung. No airspace consolidation, pleural effusion, or pulmonary edema is identified. There is no pneumothorax. No acute osseous abnormality identified. IMPRESSION: Mild scattered peribronchial thickening, which could reflect sequelae of acute bronchiolitis given provided history  of cough. No consolidative opacity to suggest pneumonia. Electronically Signed   By: Rise Mu M.D.   On: 08/25/2018 16:02    Procedures Procedures (including critical care time)  Medications Ordered in UC Medications - No data to display  Initial Impression / Assessment and Plan / UC Course  I have reviewed the triage vital signs and the nursing notes.  Pertinent labs & imaging results that were available during my care of the patient were reviewed by me and considered in my medical decision making (see chart for details).     43 year old female recently diagnosed with influenza-like illness now presents with worsening cough over the past couple of days.  Patient is afebrile.  Nontoxic-appearing.  No adventitious lung sounds heard on exam.  Chest x-ray shows mild scattered peribronchial thickening.  No consolidative opacity to suggest pneumonia.     Plan: Antibiotics and steroids per medication orders. Antitussives per medication orders. Avoid exposure to tobacco smoke and fumes. B-agonist inhaler. Avoid exposure to tobacco smoke and fumes. Call PCP or go to ED if shortness of breath worsens, blood in sputum, change in character of cough, development of fever or chills, inability to maintain nutrition and hydration.   Today's evaluation has revealed no signs of a dangerous process. Discussed diagnosis with patient. Patient aware of their diagnosis, possible red flag symptoms to watch out for and need for close follow up. Patient understands verbal and written discharge instructions. Patient comfortable with plan and disposition.   Patient has a clear mental status at this time, good insight into illness (after discussion and teaching) and has clear judgment to make decisions regarding their care.  Documentation was completed with the aid of voice recognition software. Transcription may contain typographical errors. Final Clinical Impressions(s) / UC Diagnoses   Final diagnoses:  Acute bronchitis, unspecified organism     Discharge Instructions     Take medications as prescribed.  Continue your Hycodan that was prescribed to you during your previous visit for cough.  Drink plenty of fluids.  Follow-up with your primary care provider in 1 week if you are no better.  Go to the ED if you experience chest pain, shortness of breath or any other concerning symptoms.    ED Prescriptions    Medication Sig Dispense Auth. Provider   amoxicillin-clavulanate (AUGMENTIN) 875-125 MG tablet Take 1 tablet by mouth every 12 (twelve) hours. 14 tablet Mikeria Valin, [place], FNP   predniSONE (DELTASONE) 20 MG tablet Take 2 tablets (40 mg total) by mouth daily for 5 days. 10 tablet Lurline Idol, FNP   dextromethorphan-guaiFENesin [facility] DM) 30-600 MG 12hr tablet Take 1 tablet by mouth 2 (two) times daily for 7 days. 14 tablet Lurline Idol, FNP   albuterol (PROVENTIL HFA;VENTOLIN HFA) 108 (90 Base) MCG/ACT inhaler Inhale 1-2 puffs into the lungs every 6 (six) hours as needed for wheezing or shortness of breath. 1 Inhaler Lurline Idol, FNP     Controlled Substance Prescriptions [place] Controlled Substance Registry consulted? Not Applicable   Lurline Idol, [place] 08/25/18 [phone]

## 2018-12-08 DIAGNOSIS — B373 Candidiasis of vulva and vagina: Secondary | ICD-10-CM | POA: Diagnosis not present

## 2018-12-23 ENCOUNTER — Other Ambulatory Visit (HOSPITAL_COMMUNITY)
Admission: RE | Admit: 2018-12-23 | Discharge: 2018-12-23 | Disposition: A | Discharge status: Home / Self Care | Payer: BC Managed Care – PPO | Source: Ambulatory Visit | Attending: Family Medicine | Admitting: Family Medicine

## 2018-12-23 ENCOUNTER — Other Ambulatory Visit: Payer: Self-pay

## 2018-12-23 ENCOUNTER — Other Ambulatory Visit: Payer: BC Managed Care – PPO

## 2018-12-23 VITALS — BP 131/86 | HR 86 | Temp 97.8°F | Resp 17 | Wt 182.0 lb

## 2018-12-23 DIAGNOSIS — Z13220 Encounter for screening for lipoid disorders: Secondary | ICD-10-CM | POA: Diagnosis not present

## 2018-12-23 DIAGNOSIS — E119 Type 2 diabetes mellitus without complications: Secondary | ICD-10-CM | POA: Diagnosis not present

## 2018-12-23 DIAGNOSIS — N898 Other specified noninflammatory disorders of vagina: Secondary | ICD-10-CM

## 2018-12-23 NOTE — Progress Notes (Signed)
Patient here for fasting labs & vitals check. Patient states that she is still having vaginal irritation & requested that she be able to self swab. KWalker, CMA.

## 2018-12-24 ENCOUNTER — Telehealth (INDEPENDENT_AMBULATORY_CARE_PROVIDER_SITE_OTHER): Payer: BC Managed Care – PPO | Admitting: Family Medicine

## 2018-12-24 DIAGNOSIS — E785 Hyperlipidemia, unspecified: Secondary | ICD-10-CM

## 2018-12-24 DIAGNOSIS — N760 Acute vaginitis: Secondary | ICD-10-CM

## 2018-12-24 DIAGNOSIS — E119 Type 2 diabetes mellitus without complications: Secondary | ICD-10-CM | POA: Diagnosis not present

## 2018-12-24 DIAGNOSIS — Z6833 Body mass index (BMI) 33.0-33.9, adult: Secondary | ICD-10-CM

## 2018-12-24 LAB — COMPREHENSIVE METABOLIC PANEL
ALT: 19 IU/L (ref 0–32)
AST: 16 IU/L (ref 0–40)
Albumin/Globulin Ratio: 1.4 (ref 1.2–2.2)
Albumin: 4.2 g/dL (ref 3.8–4.8)
Alkaline Phosphatase: 67 IU/L (ref 39–117)
BUN/Creatinine Ratio: 25 — ABNORMAL HIGH (ref 9–23)
BUN: 16 mg/dL (ref 6–24)
Bilirubin Total: <0.2 mg/dL (ref 0.0–1.2)
CO2: 21 mmol/L (ref 20–29)
Calcium: 9 mg/dL (ref 8.7–10.2)
Chloride: 103 mmol/L (ref 96–106)
Creatinine, Ser: 0.65 mg/dL (ref 0.57–1.00)
GFR calc Af Amer: 128 mL/min/{1.73_m2} (ref >59)
GFR calc non Af Amer: 111 mL/min/{1.73_m2} (ref >59)
Globulin, Total: 3 g/dL (ref 1.5–4.5)
Glucose: 173 mg/dL — ABNORMAL HIGH (ref 65–99)
Potassium: 4.5 mmol/L (ref 3.5–5.2)
Sodium: 138 mmol/L (ref 134–144)
Total Protein: 7.2 g/dL (ref 6.0–8.5)

## 2018-12-24 LAB — LIPID PANEL
Chol/HDL Ratio: 4.4 ratio (ref 0.0–4.4)
Cholesterol, Total: 190 mg/dL (ref 100–199)
HDL: 43 mg/dL (ref >39)
LDL Calculated: 108 mg/dL — ABNORMAL HIGH (ref 0–99)
Triglycerides: 193 mg/dL — ABNORMAL HIGH (ref 0–149)
VLDL Cholesterol Cal: 39 mg/dL (ref 5–40)

## 2018-12-24 LAB — HEMOGLOBIN A1C
Est. average glucose Bld gHb Est-mCnc: 177 mg/dL
Hgb A1c MFr Bld: 7.8 % — ABNORMAL HIGH (ref 4.8–5.6)

## 2018-12-24 LAB — CERVICOVAGINAL ANCILLARY ONLY
Bacterial vaginitis: NEGATIVE
Candida vaginitis: NEGATIVE
Chlamydia: NEGATIVE
Neisseria Gonorrhea: NEGATIVE
Trichomonas: NEGATIVE

## 2018-12-24 MED ORDER — ATORVASTATIN CALCIUM 20 MG PO TABS: 20.0000 mg | ORAL_TABLET | Freq: Every day | ORAL | 3 refills | Status: DC

## 2018-12-24 MED ORDER — METRONIDAZOLE 500 MG PO TABS: 500.0000 mg | ORAL_TABLET | Freq: Two times a day (BID) | ORAL | 0 refills | Status: DC

## 2018-12-24 MED ORDER — METFORMIN HCL 500 MG PO TABS: 500.0000 mg | ORAL_TABLET | Freq: Two times a day (BID) | ORAL | 3 refills | Status: DC

## 2018-12-24 NOTE — Progress Notes (Signed)
Virtual Visit via Video Note  I connected with Mary Howard on 12/24/18 at  1:50 PM EDT by a video enabled telemedicine application and verified that I am speaking with the correct person using two identifiers.  Location: Patient: Located at home during today's encounter  Provider: Located at primary care office     I discussed the limitations of evaluation and management by telemedicine and the availability of in person appointments. The patient expressed understanding and agreed to proceed.  History of Present Illness: Diabetes Mary Howard history of diabetes without routine home monitoring of glucose at home.  Patient was diagnosed several years ago with diabetes however through diet and exercise she was able to be taken off medication by previous provider.  During routine lab work back in November the patient was noted to have an A1c of 8.2.  She was restarted on metformin.  She has been without follow-up since that time.  Today she reports that she completely stopped taking her medication a couple months ago when she started exercising and meal planning.  She denies polyuria, polydipsia, or polyphagia.  She is currently on  statin therapy which she reports she is still taking consistently.  Patient is complaining today of vaginal odor and irritation.  She has been treated in the past for BV and suspects this is a recurrent infection.  She is requesting medication for treatment today.  Social History   Socioeconomic History  . Marital status: Divorced    Spouse name: Not on file  . Number of children: Not on file  . Years of education: Not on file  . Highest education level: Not on file  Occupational History  . Not on file  Social Needs  . Financial resource strain: Not on file  . Food insecurity    Worry: Not on file    Inability: Not on file  . Transportation needs    Medical: Not on file    Non-medical: Not on file  Tobacco Use  . Smoking status: Current Some Day Smoker  .  Smokeless tobacco: Never Used  Substance and Sexual Activity  . Alcohol use: Not Currently  . Drug use: Never  . Sexual activity: Not on file  Lifestyle  . Physical activity    Days per week: Not on file    Minutes per session: Not on file  . Stress: Not on file  Relationships  . Social Herbalist on phone: Not on file    Gets together: Not on file    Attends religious service: Not on file    Active member of club or organization: Not on file    Attends meetings of clubs or organizations: Not on file    Relationship status: Not on file  . Intimate partner violence    Fear of current or ex partner: Not on file    Emotionally abused: Not on file    Physically abused: Not on file    Forced sexual activity: Not on file  Other Topics Concern  . Not on file  Social History Narrative  . Not on file   Assessment and Plan: Reviewed labs collected prior to today's video encounter.  1. Type 2 diabetes mellitus without complication, without long-term current use of insulin (HCC) Aim for 30 minutes of exercise most days, with a goal of 150 minutes per week. -Glucose monitoring at minimal of twice daily and keep a log of readings. -Commit to medication adherence, expressed that uncontrolled diabetes is more  difficult to get under control. -Goal A1C <7.0 -increase foods containing whole grains (one-half of grain intake). -saturated fat intake should be reduced -reduce intake of trans fat (lowers LDL cholesterol and increases HDL cholesterol) -Eat 4-5 small meals during the day to reduce the risk of becoming hungry.   2. BMI 33.0-33.9,adult Encouraged efforts to reduce weight include engaging in physical activity as tolerated with goal of 150 minutes per week. Improve dietary choices and eat a meal regimen consistent with a Mediterranean or DASH diet. Reduce simple carbohydrates. Do not skip meals and eat healthy snacks throughout the day to avoid over-eating at dinner. Set a  goal weight loss that is achievable for you.   3. Vaginitis and vulvovaginitis -Will treat empirically for bacterial vaginosis, start Metronidazole 500 mg BID  4.  Hyperlipidemia Recent lipid panel shows some improvement however patient still has elevated triglycerides.  Encourage diet management along with consistently taking statin therapy as prescribed.  Also encouraged to increase intake of vegetables and fruits.    Meds ordered this encounter  Medications  . metroNIDAZOLE (FLAGYL) 500 MG tablet    Sig: Take 1 tablet (500 mg total) by mouth 2 (two) times daily with a meal. DO NOT CONSUME ALCOHOL WHILE TAKING THIS MEDICATION.    Dispense:  14 tablet    Refill:  0  . metFORMIN (GLUCOPHAGE) 500 MG tablet    Sig: Take 1 tablet (500 mg total) by mouth 2 (two) times daily with a meal.    Dispense:  180 tablet    Refill:  3  . atorvastatin (LIPITOR) 20 MG tablet    Sig: Take 1 tablet (20 mg total) by mouth daily.    Dispense:  90 tablet    Refill:  3    Follow Up Instructions: Follow-up in 3 months for diabetes management.    I discussed the assessment and treatment plan with the patient. The patient was provided an opportunity to ask questions and all were answered. The patient agreed with the plan and demonstrated an understanding of the instructions.   The patient was advised to call back or seek an in-person evaluation if the symptoms worsen or if the condition fails to improve as anticipated.  I provided 25 minutes of non-face-to-face time during this encounter.   Joaquin CourtsKimberly Legend Pecore, FNP

## 2019-01-08 ENCOUNTER — Other Ambulatory Visit: Payer: Self-pay | Admitting: Family Medicine

## 2019-01-08 ENCOUNTER — Encounter: Payer: Self-pay | Admitting: Family Medicine

## 2019-01-08 MED ORDER — FLUCONAZOLE 150 MG PO TABS: 150.0000 mg | ORAL_TABLET | Freq: Once | ORAL | 0 refills | Status: AC

## 2019-01-12 ENCOUNTER — Encounter: Payer: Self-pay | Admitting: Family Medicine

## 2019-01-12 ENCOUNTER — Other Ambulatory Visit: Payer: Self-pay

## 2019-01-12 ENCOUNTER — Ambulatory Visit (INDEPENDENT_AMBULATORY_CARE_PROVIDER_SITE_OTHER): Payer: BC Managed Care – PPO | Admitting: Family Medicine

## 2019-01-12 ENCOUNTER — Telehealth: Payer: Self-pay | Admitting: *Deleted

## 2019-01-12 ENCOUNTER — Encounter: Payer: Self-pay | Admitting: Physician Assistant

## 2019-01-12 ENCOUNTER — Telehealth: Payer: BC Managed Care – PPO | Admitting: Physician Assistant

## 2019-01-12 DIAGNOSIS — R6889 Other general symptoms and signs: Secondary | ICD-10-CM | POA: Diagnosis not present

## 2019-01-12 DIAGNOSIS — Z20828 Contact with and (suspected) exposure to other viral communicable diseases: Secondary | ICD-10-CM

## 2019-01-12 DIAGNOSIS — Z20822 Contact with and (suspected) exposure to covid-19: Secondary | ICD-10-CM

## 2019-01-12 DIAGNOSIS — R509 Fever, unspecified: Secondary | ICD-10-CM

## 2019-01-12 NOTE — Telephone Encounter (Signed)
Pt scheduled for covid testing today @ 12:15 @ [facility]. Instructions given and order placed. Pt is trying to find a rapid testing site d/t she is the only one working in her household and can not return to work until the results come back. She will call me back to cancel if she finds somewhere that has a rapid test.

## 2019-01-12 NOTE — Progress Notes (Signed)
Virtual Visit via Telephone Note  I connected with Mary Howard on 01/12/19 at 10:10 AM EDT by telephone and verified that I am speaking with the correct person using two identifiers.  Location: Patient: Located at home during today's encounter  Provider: Located at primary care office     I discussed the limitations, risks, security and privacy concerns of performing an evaluation and management service by telephone and the availability of in person appointments. I also discussed with the patient that there may be a patient responsible charge related to this service. The patient expressed understanding and agreed to proceed.   History of Present Illness: Mary Howard is present via today's telemedicine encounter with a concern for possible COVID-19 infection. She is employed at [employer] and present to work today for routine COVID-19 screening and had a fever x2 checks of 101.  She is asymptomatic of any other symptoms.  No recent URI symptoms or GI symptoms.  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  Her employer is requiring a negative COVID-19 test along with her remaining afebrile prior to her return back to work.   Assessment and Plan: 1. Suspected Covid-19 Virus Infection -Request sent electronically to the [facility] requesting [id number] testing for patient. Patient advised if results are negative we will notify her via MyChart a result.  Advised to results are positive she will be notified via phone.  She will also need a note for work stating whether she had a negative result in order for her to return.  Advised I can provide that documentation as well once the results have resulted as negative.  Patient verbalized understanding and agreement with plan.  Information sent via my chart regarding home isolation and how to protect herself and others from acquiring the infection while her tests are pending. Follow Up Instructions: We  will follow-up with test results.   I discussed the assessment and treatment plan with the patient. The patient was provided an opportunity to ask questions and all were answered. The patient agreed with the plan and demonstrated an understanding of the instructions.   The patient was advised to call back or seek an in-person evaluation if the symptoms worsen or if the condition fails to improve as anticipated.  I provided 20 minutes of non-face-to-face time during this encounter.   Molli Barrows, FNP

## 2019-01-12 NOTE — Telephone Encounter (Signed)
-----   Message from Scot Jun, [place] sent at 01/12/2019 10:16 AM EDT ----- Regarding: Needs COVID-19 Testing Patient sent home from work with a fever 101.0 F.   Molli Barrows, FNP Primary Care at [facility] [address] [place] [phone]: [phone]

## 2019-01-12 NOTE — Progress Notes (Signed)
E-Visit for Corona Virus Screening   Your current symptoms could be consistent with the coronavirus.  Call your health care provider or local health department to request and arrange formal testing. Many health care providers can now test patients at their office but not all are.  Please quarantine yourself while awaiting your test results.  [place] [facility] [phone], [place] [facility] [phone], [place] [facility] [phone] or visit [email or web address]-[email or web address]  and You have been enrolled in MyChart Home Monitoring for COVID-19.  Daily you will receive a questionnaire within the MyChart website. Our COVID-19 response team will be monitoring your responses daily.    COVID-19 is a respiratory illness with symptoms that are similar to the flu. Symptoms are typically mild to moderate, but there have been cases of severe illness and death due to the virus. The following symptoms may appear 2-14 days after exposure: . Fever . Cough . Shortness of breath or difficulty breathing . Chills . Repeated shaking with chills . Muscle pain . Headache . Sore throat . New loss of taste or smell . Fatigue . Congestion or runny nose . Nausea or vomiting . Diarrhea  It is vitally important that if you feel that you have an infection such as this virus or any other virus that you stay home and away from places where you may spread it to others.  You should self-quarantine for 14 days if you have symptoms that could potentially be coronavirus or have been in close contact a with a person diagnosed with COVID-19 within the last 2 weeks. You should avoid contact with people age 65 and older.   You should wear a mask or cloth face covering over your nose and mouth if you must be around other people or animals, including pets (even at home). Try to stay at least 6 feet away from other people. This will protect the  people around you.   You may also take acetaminophen (Tylenol) as needed for fever  I have provided a work note   Reduce your risk of any infection by using the same precautions used for avoiding the common cold or flu:  . Wash your hands often with soap and warm water for at least 20 seconds.  If soap and water are not readily available, use an alcohol-based hand sanitizer with at least 60% alcohol.  . If coughing or sneezing, cover your mouth and nose by coughing or sneezing into the elbow areas of your shirt or coat, into a tissue or into your sleeve (not your hands). . Avoid shaking hands with others and consider head nods or verbal greetings only. . Avoid touching your eyes, nose, or mouth with unwashed hands.  . Avoid close contact with people who are sick. . Avoid places or events with large numbers of people in one location, like concerts or sporting events. . Carefully consider travel plans you have or are making. . If you are planning any travel outside or inside the [place], visit the CDC's Travelers' Health webpage for the latest health notices. . If you have some symptoms but not all symptoms, continue to monitor at home and seek medical attention if your symptoms worsen. . If you are having a medical emergency, call 911.  HOME CARE . Only take medications as instructed by your medical team. . Drink plenty of fluids and get plenty of rest. . A steam or ultrasonic humidifier can help if you have congestion.   GET HELP RIGHT AWAY IF YOU   HAVE EMERGENCY WARNING SIGNS** FOR COVID-19. If you or someone is showing any of these signs seek emergency medical care immediately. Call 911 or proceed to your closest emergency facility if: . You develop worsening high fever. . Trouble breathing . Bluish lips or face . Persistent pain or pressure in the chest . New confusion . Inability to wake or stay awake . You cough up blood. . Your symptoms become more severe  **This list is not all  possible symptoms. Contact your medical provider for any symptoms that are sever or concerning to you.   MAKE SURE YOU   Understand these instructions.  Will watch your condition.  Will get help right away if you are not doing well or get worse.  Your e-visit answers were reviewed by a board certified advanced clinical practitioner to complete your personal care plan.  Depending on the condition, your plan could have included both over the counter or prescription medications.  If there is a problem please reply once you have received a response from your provider.  Your safety is important to us.  If you have drug allergies check your prescription carefully.    You can use MyChart to ask questions about today's visit, request a non-urgent call back, or ask for a work or school excuse for 24 hours related to this e-Visit. If it has been greater than 24 hours you will need to follow up with your provider, or enter a new e-Visit to address those concerns. You will get an e-mail in the next two days asking about your experience.  I hope that your e-visit has been valuable and will speed your recovery. Thank you for using e-visits.   I spent 5-10 minutes on review and completion of this note- Ruffus Kamaka PAC  

## 2019-01-15 ENCOUNTER — Encounter (INDEPENDENT_AMBULATORY_CARE_PROVIDER_SITE_OTHER): Payer: Self-pay

## 2019-01-16 LAB — NOVEL CORONAVIRUS, NAA: SARS-CoV-2, NAA: NOT DETECTED

## 2019-01-26 ENCOUNTER — Other Ambulatory Visit: Payer: Self-pay

## 2019-01-26 ENCOUNTER — Ambulatory Visit (INDEPENDENT_AMBULATORY_CARE_PROVIDER_SITE_OTHER): Payer: BC Managed Care – PPO | Admitting: Family Medicine

## 2019-01-26 ENCOUNTER — Encounter: Payer: Self-pay | Admitting: Family Medicine

## 2019-01-26 DIAGNOSIS — F419 Anxiety disorder, unspecified: Secondary | ICD-10-CM | POA: Diagnosis not present

## 2019-01-26 DIAGNOSIS — F0630 Mood disorder due to known physiological condition, unspecified: Secondary | ICD-10-CM | POA: Diagnosis not present

## 2019-01-26 DIAGNOSIS — F5113 Hypersomnia due to other mental disorder: Secondary | ICD-10-CM

## 2019-01-26 DIAGNOSIS — E119 Type 2 diabetes mellitus without complications: Secondary | ICD-10-CM | POA: Diagnosis not present

## 2019-01-26 DIAGNOSIS — G479 Sleep disorder, unspecified: Secondary | ICD-10-CM

## 2019-01-26 MED ORDER — TRAZODONE HCL 50 MG PO TABS: 25.0000 mg | ORAL_TABLET | Freq: Every evening | ORAL | 3 refills | Status: DC | PRN

## 2019-01-26 NOTE — Progress Notes (Signed)
Virtual Visit via Telephone Note  I connected with Mary Howard on 01/26/19 at  2:50 PM EDT by telephone and verified that I am speaking with the correct person using two identifiers.   I discussed the limitations, risks, security and privacy concerns of performing an evaluation and management service by telephone and the availability of in person appointments. I also discussed with the patient that there may be a patient responsible charge related to this service. The patient expressed understanding and agreed to proceed.  Patient Location: Parked Writer Location: [facility] office Others participating in call: call to patient was initiated by Allyne Gee, RMA   History of Present Illness:      43 year old female who is being seen today to issues with feeling more irritable and sometimes crying/becoming tearful for no reason.  She feels that she gets upset more easily recently.  She is also noticed difficulty with sleeping as she has both difficulty falling asleep as well as difficulty staying asleep.  She states that she has noticed the symptoms more over the past 2 months.  She is not sure if this is related to the COVID-19 pandemic as she previously was exercising on a regular basis and now feels that she has gained weight and is not exercising.  She does plan to start exercising again through [employer] classes that her former Physiological scientist is arranging.  She states that she does have a family history of both a brother and sister with bipolar disorder and she is concerned that she may be developing a mental health issue.  She states that usually she is very positive and outgoing but now even some of her coworkers have asked her if anything is wrong.  She denies any symptoms such as headaches or dizziness.  She does have fatigue.  She has had no chest pain or palpitations, no shortness of breath or cough.  She did recently have a fever but tested negative for COVID-19.  She denies any abdominal  pain-no nausea or vomiting.  No suicidal thoughts or ideations.  No self-harm or intention of harming others.  She has tried over-the-counter melatonin 5 mg and states that taking the recommended max of 10 mg does not help her fall asleep.  She has tried taking 15 mg which caused her to be drowsy the next day.  She does not watch television or have any distractions while attempting to fall asleep.   Past Medical History:  Diagnosis Date  . Diabetes mellitus without complication [facility])     Past Surgical History:  Procedure Laterality Date  . ABDOMINAL HYSTERECTOMY    . CHOLECYSTECTOMY      Family History  Problem Relation Age of Onset  . Diabetes Mother   . Bipolar disorder Mother   . Hypertension Mother   . Hyperlipidemia Father   . Heart disease Father   . Stroke Father   . Cancer Paternal Uncle   . Cancer Paternal Grandmother   . Cancer Paternal Grandfather   . Bipolar disorder Sister     Social History   Tobacco Use  . Smoking status: Current Some Day Smoker  . Smokeless tobacco: Never Used  Substance Use Topics  . Alcohol use: Not Currently  . Drug use: Never     No Known Allergies     Observations/Objective: No vital signs or physical exam conducted as visit was done via telephone  Assessment and Plan: 1. Anxiety; 2. Sleep disorder with mood complaints Patient with complaint of some  recent increase in irritability and sometimes tearfulness for no reason.  She has also developed onset of issues with sleep but reports family history significant for siblings with bipolar disorder.  Patient would like to see a psychologist for counseling.  Referral has been placed.  Patient is also having issues with sleep and prescription provided for trazodone 50 mg and she can take one half or 1 tablet at bedtime to help with sleep.  Patient is currently engaging in good sleep hygiene.  She should call or return sooner if sleep is not improved otherwise she has been asked to follow-up in  4 to 6 weeks. - traZODone (DESYREL) 50 MG tablet; Take 0.5-1 tablets (25-50 mg total) by mouth at bedtime as needed for sleep.  Dispense: 30 tablet; Refill: 3 - Ambulatory referral to Psychology   Follow Up Instructions:Return in about 4 weeks (around 02/23/2019) for anxiety/sleep issues f/u in 4-6 weeks.    I discussed the assessment and treatment plan with the patient. The patient was provided an opportunity to ask questions and all were answered. The patient agreed with the plan and demonstrated an understanding of the instructions.   The patient was advised to call back or seek an in-person evaluation if the symptoms worsen or if the condition fails to improve as anticipated.  I provided 18 minutes of non-face-to-face time during this encounter.   Cain Saupeammie Solveig Fangman, MD

## 2019-02-03 ENCOUNTER — Ambulatory Visit (INDEPENDENT_AMBULATORY_CARE_PROVIDER_SITE_OTHER): Payer: BC Managed Care – PPO | Admitting: Psychology

## 2019-02-03 DIAGNOSIS — F331 Major depressive disorder, recurrent, moderate: Secondary | ICD-10-CM

## 2019-03-02 ENCOUNTER — Encounter: Payer: Self-pay | Admitting: Family Medicine

## 2019-03-02 ENCOUNTER — Telehealth (INDEPENDENT_AMBULATORY_CARE_PROVIDER_SITE_OTHER): Payer: BC Managed Care – PPO | Admitting: Family Medicine

## 2019-03-02 DIAGNOSIS — F5113 Hypersomnia due to other mental disorder: Secondary | ICD-10-CM | POA: Diagnosis not present

## 2019-03-02 DIAGNOSIS — F0630 Mood disorder due to known physiological condition, unspecified: Secondary | ICD-10-CM

## 2019-03-02 DIAGNOSIS — G479 Sleep disorder, unspecified: Secondary | ICD-10-CM

## 2019-03-02 DIAGNOSIS — F411 Generalized anxiety disorder: Secondary | ICD-10-CM

## 2019-03-02 MED ORDER — TRAZODONE HCL 50 MG PO TABS: 25.0000 mg | ORAL_TABLET | Freq: Every evening | ORAL | 3 refills | Status: AC | PRN

## 2019-03-02 MED ORDER — BUSPIRONE HCL 15 MG PO TABS: 15.0000 mg | ORAL_TABLET | Freq: Two times a day (BID) | ORAL | 1 refills | Status: DC

## 2019-03-02 NOTE — Progress Notes (Signed)
Virtual Visit via Telephone Note  I connected with@ on 03/02/19 at  1:30 PM EDT by telephone and verified that I am speaking with the correct person using two identifiers.   I discussed the limitations, risks, security and privacy concerns of performing an evaluation and management service by telephone and the availability of in person appointments. I also discussed with the patient that there may be a patient responsible charge related to this service. The patient expressed understanding and agreed to proceed.  Patient Location: Home  [facility] Others participating in call: Call initiated by Esmond Plantsara Walker, CMA who then transferred the call.  Visit was initially to be through my chart but patient was unable to connect her video therefore  converted to tele-health visit.   History of Present Illness:      43 yo female seen in follow-up of July visit for anxiety and sleeplessness.  Patient reports that she is currently taking 1 of the 50 mg trazodone around 8:30 PM and then she falls asleep well she does tend to wake up at 3 or 4 AM but is able to fall back asleep easily.  She does feel less irritable since she is getting better sleep.  She however reports that she continues to have some issues with worrying often and feeling anxious.  She reports that she was on Zoloft in the past for anxiety.  She does not remember the dose but states that she was placed on low dose and then it was increased once but this never really seem to help.  She does feel that she needs medication to help with her anxiety.        On review of systems, she denies any chest pain or palpitations, no shortness of breath or cough, no headaches or dizziness.  Fatigue has improved.  She denies any suicidal thoughts or ideations.   Past Medical History:  Diagnosis Date  . Diabetes mellitus without complication [facility](HCC)     Past Surgical History:  Procedure Laterality Date  . ABDOMINAL HYSTERECTOMY    .  CHOLECYSTECTOMY      Family History  Problem Relation Age of Onset  . Diabetes Mother   . Bipolar disorder Mother   . Hypertension Mother   . Hyperlipidemia Father   . Heart disease Father   . Stroke Father   . Cancer Paternal Uncle   . Cancer Paternal Grandmother   . Cancer Paternal Grandfather   . Bipolar disorder Sister     Social History   Tobacco Use  . Smoking status: Current Some Day Smoker  . Smokeless tobacco: Never Used  Substance Use Topics  . Alcohol use: Not Currently  . Drug use: Never     No Known Allergies     Observations/Objective: No vital signs or physical exam conducted as visit was done via telephone  Assessment and Plan: 1. GAD (generalized anxiety disorder) We will have patient start BuSpar 15 mg twice daily and discussed with the patient that this can be increased to 3 times daily or higher dose if needed but that she should try to take the medication for at least 4 to 6 weeks before deciding whether or not the medication is helping with her anxiety as it can take 4 to 6 weeks for medication to reach maximal effectiveness.  Patient made aware that nausea can be a common side effect with use of this medication and that nausea should resolve as she continues to use a medication  but she should also call or return if her symptoms continue.  Patient should make follow-up or seek medical attention if she has any suicidal thoughts or ideations. - busPIRone (BUSPAR) 15 MG tablet; Take 1 tablet (15 mg total) by mouth 2 (two) times daily.  Dispense: 60 tablet; Refill: 1 - traZODone (DESYREL) 50 MG tablet; Take 0.5-1 tablets (25-50 mg total) by mouth at bedtime as needed for sleep.  Dispense: 30 tablet; Refill: 3  2. Sleep disorder with mood complaints She reports increased sleep and decreased irritability with use of trazodone.  Refill prescription provided at today's visit - traZODone (DESYREL) 50 MG tablet; Take 0.5-1 tablets (25-50 mg total) by mouth at  bedtime as needed for sleep.  Dispense: 30 tablet; Refill: 3  Follow Up Instructions:Return in about 6 weeks (around 04/13/2019) for Anxiety and diabetes follow-up; call sooner if needed.    I discussed the assessment and treatment plan with the patient. The patient was provided an opportunity to ask questions and all were answered. The patient agreed with the plan and demonstrated an understanding of the instructions.   The patient was advised to call back or seek an in-person evaluation if the symptoms worsen or if the condition fails to improve as anticipated.  I provided 11 minutes of non-face-to-face time during this encounter.   Antony Blackbird, MD

## 2019-03-03 ENCOUNTER — Ambulatory Visit (INDEPENDENT_AMBULATORY_CARE_PROVIDER_SITE_OTHER): Payer: BC Managed Care – PPO | Admitting: Psychology

## 2019-03-03 DIAGNOSIS — F331 Major depressive disorder, recurrent, moderate: Secondary | ICD-10-CM | POA: Diagnosis not present

## 2019-03-17 ENCOUNTER — Ambulatory Visit (INDEPENDENT_AMBULATORY_CARE_PROVIDER_SITE_OTHER): Payer: BC Managed Care – PPO | Admitting: Psychology

## 2019-03-17 DIAGNOSIS — F331 Major depressive disorder, recurrent, moderate: Secondary | ICD-10-CM | POA: Diagnosis not present

## 2019-03-25 ENCOUNTER — Telehealth: Payer: Self-pay

## 2019-03-25 NOTE — Telephone Encounter (Signed)

## 2019-03-26 ENCOUNTER — Ambulatory Visit (INDEPENDENT_AMBULATORY_CARE_PROVIDER_SITE_OTHER): Payer: BC Managed Care – PPO | Admitting: Family Medicine

## 2019-03-26 ENCOUNTER — Other Ambulatory Visit: Payer: Self-pay

## 2019-03-26 VITALS — BP 122/79 | HR 69 | Temp 97.2°F | Resp 17 | Ht 62.0 in | Wt 177.4 lb

## 2019-03-26 DIAGNOSIS — F411 Generalized anxiety disorder: Secondary | ICD-10-CM | POA: Diagnosis not present

## 2019-03-26 DIAGNOSIS — Z1159 Encounter for screening for other viral diseases: Secondary | ICD-10-CM | POA: Diagnosis not present

## 2019-03-26 DIAGNOSIS — E119 Type 2 diabetes mellitus without complications: Secondary | ICD-10-CM

## 2019-03-26 DIAGNOSIS — F5113 Hypersomnia due to other mental disorder: Secondary | ICD-10-CM

## 2019-03-26 DIAGNOSIS — F0630 Mood disorder due to known physiological condition, unspecified: Secondary | ICD-10-CM

## 2019-03-26 DIAGNOSIS — G479 Sleep disorder, unspecified: Secondary | ICD-10-CM

## 2019-03-26 LAB — GLUCOSE, POCT (MANUAL RESULT ENTRY): POC Glucose: 124 mg/dl — AB (ref 70–99)

## 2019-03-26 NOTE — Progress Notes (Signed)
Patient is fasting.  Doesn't check blood sugars at home.  Declines flu shot.

## 2019-03-26 NOTE — Patient Instructions (Signed)
Diabetes Mellitus and Foot Care Foot care is an important part of your health, especially when you have diabetes. Diabetes may cause you to have problems because of poor blood flow (circulation) to your feet and legs, which can cause your skin to:  Become thinner and drier.  Break more easily.  Heal more slowly.  Peel and crack. You may also have nerve damage (neuropathy) in your legs and feet, causing decreased feeling in them. This means that you may not notice minor injuries to your feet that could lead to more serious problems. Noticing and addressing any potential problems early is the best way to prevent future foot problems. How to care for your feet Foot hygiene  Wash your feet daily with warm water and mild soap. Do not use hot water. Then, pat your feet and the areas between your toes until they are completely dry. Do not soak your feet as this can dry your skin.  Trim your toenails straight across. Do not dig under them or around the cuticle. File the edges of your nails with an emery board or nail file.  Apply a moisturizing lotion or petroleum jelly to the skin on your feet and to dry, brittle toenails. Use lotion that does not contain alcohol and is unscented. Do not apply lotion between your toes. Shoes and socks  Wear clean socks or stockings every day. Make sure they are not too tight. Do not wear knee-high stockings since they may decrease blood flow to your legs.  Wear shoes that fit properly and have enough cushioning. Always look in your shoes before you put them on to be sure there are no objects inside.  To break in new shoes, wear them for just a few hours a day. This prevents injuries on your feet. Wounds, scrapes, corns, and calluses  Check your feet daily for blisters, cuts, bruises, sores, and redness. If you cannot see the bottom of your feet, use a mirror or ask someone for help.  Do not cut corns or calluses or try to remove them with medicine.  If you  find a minor scrape, cut, or break in the skin on your feet, keep it and the skin around it clean and dry. You may clean these areas with mild soap and water. Do not clean the area with peroxide, alcohol, or iodine.  If you have a wound, scrape, corn, or callus on your foot, look at it several times a day to make sure it is healing and not infected. Check for: ? Redness, swelling, or pain. ? Fluid or blood. ? Warmth. ? Pus or a bad smell. General instructions  Do not cross your legs. This may decrease blood flow to your feet.  Do not use heating pads or hot water bottles on your feet. They may burn your skin. If you have lost feeling in your feet or legs, you may not know this is happening until it is too late.  Protect your feet from hot and cold by wearing shoes, such as at the beach or on hot pavement.  Schedule a complete foot exam at least once a year (annually) or more often if you have foot problems. If you have foot problems, report any cuts, sores, or bruises to your health care provider immediately. Contact a health care provider if:  You have a medical condition that increases your risk of infection and you have any cuts, sores, or bruises on your feet.  You have an injury that is not   healing.  You have redness on your legs or feet.  You feel burning or tingling in your legs or feet.  You have pain or cramps in your legs and feet.  Your legs or feet are numb.  Your feet always feel cold.  You have pain around a toenail. Get help right away if:  You have a wound, scrape, corn, or callus on your foot and: ? You have pain, swelling, or redness that gets worse. ? You have fluid or blood coming from the wound, scrape, corn, or callus. ? Your wound, scrape, corn, or callus feels warm to the touch. ? You have pus or a bad smell coming from the wound, scrape, corn, or callus. ? You have a fever. ? You have a red line going up your leg. Summary  Check your feet every day  for cuts, sores, red spots, swelling, and blisters.  Moisturize feet and legs daily.  Wear shoes that fit properly and have enough cushioning.  If you have foot problems, report any cuts, sores, or bruises to your health care provider immediately.  Schedule a complete foot exam at least once a year (annually) or more often if you have foot problems. This information is not intended to replace advice given to you by your health care provider. Make sure you discuss any questions you have with your health care provider. Document Released: 06/15/2000 Document Revised: 07/31/2017 Document Reviewed: 07/20/2016 Elsevier Patient Education  2020 [employer]vier Inc[employer]  

## 2019-03-26 NOTE — Progress Notes (Signed)
Subjective:  Patient ID: Mary Howard, female    DOB: 12/08/1975  Age: 43 y.o. MRN: [id number]  CC: Diabetes and Hyperlipidemia   HPI Mary Howard is a 43 year old female with Type 2 Dm (A1c 7.8), Anxiety here for a follow up visit. She was commenced on Buspar last month for anxiety with resulting improvement in symptoms but had to decrease to 15mg  qhs due to dizziness and somnolence associated with it. Trazodone has also helped her insomnia. She endorses compliance with Metformin and has been exercising regularly. She has a trainer 3 days/week and goes walking 2 days/weke. She has also changed her diet and lost 8 lbs in the last 3 weeks. She is hoping she can come off metformin. She denies visual concerns or neuropathy symptoms except in the tip of her right index finger where she sustained a cut some months back. She has no additional concerns.  Past Medical History:  Diagnosis Date  . Diabetes mellitus without complication [facility](HCC)     Past Surgical History:  Procedure Laterality Date  . ABDOMINAL HYSTERECTOMY    . CHOLECYSTECTOMY      Family History  Problem Relation Age of Onset  . Diabetes Mother   . Bipolar disorder Mother   . Hypertension Mother   . Hyperlipidemia Father   . Heart disease Father   . Stroke Father   . Cancer Paternal Uncle   . Cancer Paternal Grandmother   . Cancer Paternal Grandfather   . Bipolar disorder Sister     No Known Allergies  Outpatient Medications Prior to Visit  Medication Sig Dispense Refill  . atorvastatin (LIPITOR) 20 MG tablet Take 1 tablet (20 mg total) by mouth daily. 90 tablet 3  . busPIRone (BUSPAR) 15 MG tablet Take 1 tablet (15 mg total) by mouth 2 (two) times daily. 60 tablet 1  . metFORMIN (GLUCOPHAGE) 500 MG tablet Take 1 tablet (500 mg total) by mouth 2 (two) times daily with a meal. 180 tablet 3  . traZODone (DESYREL) 50 MG tablet Take 0.5-1 tablets (25-50 mg total) by mouth at bedtime as needed for sleep. 30 tablet 3    No facility-administered medications prior to visit.      ROS Review of Systems  Constitutional: Negative for activity change, appetite change and fatigue.  HENT: Negative for congestion, sinus pressure and sore throat.   Eyes: Negative for visual disturbance.  Respiratory: Negative for cough, chest tightness, shortness of breath and wheezing.   Cardiovascular: Negative for chest pain and palpitations.  Gastrointestinal: Negative for abdominal distention, abdominal pain and constipation.  Endocrine: Negative for polydipsia.  Genitourinary: Negative for dysuria and frequency.  Musculoskeletal: Negative for arthralgias and back pain.  Skin: Negative for rash.  Neurological: Negative for tremors, light-headedness and numbness.  Hematological: Does not bruise/bleed easily.  Psychiatric/Behavioral: Negative for agitation and behavioral problems.    Objective:  BP 122/79   Pulse 69   Temp (!) 97.2 F (36.2 C) (Temporal)   Resp 17   Ht 5\' 2"  (1.575 m)   Wt 177 lb 6.4 oz (80.5 kg)   SpO2 98%   BMI 32.45 kg/m   BP/Weight 03/26/2019 12/23/2018 08/25/2018  Systolic BP 122 131 133  Diastolic BP 79 86 83  Wt. (Lbs) 177.4 182 -  BMI 32.45 33.29 -      Physical Exam Constitutional:      Appearance: She is well-developed.  Neck:     Vascular: No JVD.  Cardiovascular:     Rate  and Rhythm: Normal rate.     Heart sounds: Normal heart sounds. No murmur.  Pulmonary:     Effort: Pulmonary effort is normal.     Breath sounds: Normal breath sounds. No wheezing or rales.  Chest:     Chest wall: No tenderness.  Abdominal:     General: Bowel sounds are normal. There is no distension.     Palpations: Abdomen is soft. There is no mass.     Tenderness: There is no abdominal tenderness.  Musculoskeletal: Normal range of motion.     Right lower leg: No edema.     Left lower leg: No edema.  Neurological:     Mental Status: She is alert and oriented to person, place, and time.   Psychiatric:        Mood and Affect: Mood normal.     CMP Latest Ref Rng & Units 12/23/2018 05/12/2018  Glucose 65 - 99 mg/dL 924(Q) 683(M)  BUN 6 - 24 mg/dL 16 12  Creatinine 1.96 - 1.00 mg/dL 2.22 9.79  Sodium 892 - 144 mmol/L 138 141  Potassium 3.5 - 5.2 mmol/L 4.5 4.3  Chloride 96 - 106 mmol/L 103 104  CO2 20 - 29 mmol/L 21 24  Calcium 8.7 - 10.2 mg/dL 9.0 9.4  Total Protein 6.0 - 8.5 g/dL 7.2 7.5  Total Bilirubin 0.0 - 1.2 mg/dL <1.1 0.2  Alkaline Phos 39 - 117 IU/L 67 78  AST 0 - 40 IU/L 16 16  ALT 0 - 32 IU/L 19 19    Lipid Panel     Component Value Date/Time   CHOL 190 12/23/2018 0842   TRIG 193 (H) 12/23/2018 0842   HDL 43 12/23/2018 0842   CHOLHDL 4.4 12/23/2018 0842   LDLCALC 108 (H) 12/23/2018 0842    CBC    Component Value Date/Time   WBC 9.0 05/12/2018 1100   RBC 4.81 05/12/2018 1100   HGB 14.4 05/12/2018 1100   HCT 42.3 05/12/2018 1100   PLT 317 05/12/2018 1100   MCV 88 05/12/2018 1100   MCH 29.9 05/12/2018 1100   MCHC 34.0 05/12/2018 1100   RDW 13.0 05/12/2018 1100   LYMPHSABS 3.2 (H) 05/12/2018 1100   EOSABS 0.1 05/12/2018 1100   BASOSABS 0.0 05/12/2018 1100    Lab Results  Component Value Date   HGBA1C 7.8 (H) 12/23/2018    Assessment & Plan:   1. Type 2 diabetes mellitus without complication, without long-term current use of insulin (HCC) Not fully optimized with A1c of 7.8 which is above goal of <7.0 If A1c is still elevated, consider increasing Metformin dose Counseled on Diabetic diet, my plate method, 941 minutes of moderate intensity exercise/week Keep blood sugar logs with fasting goals of 80-120 mg/dl, random of less than 740 and in the event of sugars less than 60 mg/dl or greater than 814 mg/dl please notify the clinic ASAP. It is recommended that you undergo annual eye exams and annual foot exams. Pneumonia vaccine is recommended. - Microalbumin/Creatinine Ratio, Urine - Glucose (CBG) - Lipid panel - Basic Metabolic  Panel - Hemoglobin A1c  2. Sleep disorder with mood complaints Controlled on Trazodone  3. GAD (generalized anxiety disorder) Controlled on Buspar  4. Screening for viral disease - HIV Antibody (routine testing w rflx)   Health Care Maintenance: due for Tdap - would like to think about it No orders of the defined types were placed in this encounter.   Follow-up: Return in about 3 months (around 06/25/2019) for  chronic medical conditions.       Charlott Rakes, MD, FAAFP. [facility] [place], [place]   03/26/2019, 9:14 AM

## 2019-03-27 ENCOUNTER — Ambulatory Visit: Payer: BC Managed Care – PPO | Admitting: Family Medicine

## 2019-03-27 LAB — BASIC METABOLIC PANEL
BUN/Creatinine Ratio: 24 — ABNORMAL HIGH (ref 9–23)
BUN: 17 mg/dL (ref 6–24)
CO2: 23 mmol/L (ref 20–29)
Calcium: 9.5 mg/dL (ref 8.7–10.2)
Chloride: 104 mmol/L (ref 96–106)
Creatinine, Ser: 0.72 mg/dL (ref 0.57–1.00)
GFR calc Af Amer: 121 mL/min/{1.73_m2} (ref >59)
GFR calc non Af Amer: 105 mL/min/{1.73_m2} (ref >59)
Glucose: 124 mg/dL — ABNORMAL HIGH (ref 65–99)
Potassium: 4.3 mmol/L (ref 3.5–5.2)
Sodium: 140 mmol/L (ref 134–144)

## 2019-03-27 LAB — HEMOGLOBIN A1C
Est. average glucose Bld gHb Est-mCnc: 154 mg/dL
Hgb A1c MFr Bld: 7 % — ABNORMAL HIGH (ref 4.8–5.6)

## 2019-03-27 LAB — MICROALBUMIN / CREATININE URINE RATIO
Creatinine, Urine: 152.1 mg/dL
Microalb/Creat Ratio: 45 mg/g creat — ABNORMAL HIGH (ref 0–29)
Microalbumin, Urine: 68.3 ug/mL

## 2019-03-27 LAB — LIPID PANEL
Chol/HDL Ratio: 2.6 ratio (ref 0.0–4.4)
Cholesterol, Total: 105 mg/dL (ref 100–199)
HDL: 40 mg/dL (ref >39)
LDL Chol Calc (NIH): 42 mg/dL (ref 0–99)
Triglycerides: 128 mg/dL (ref 0–149)
VLDL Cholesterol Cal: 23 mg/dL (ref 5–40)

## 2019-03-27 LAB — HIV ANTIBODY (ROUTINE TESTING W REFLEX): HIV Screen 4th Generation wRfx: NONREACTIVE

## 2019-04-01 ENCOUNTER — Ambulatory Visit: Payer: BC Managed Care – PPO | Admitting: Psychology

## 2019-04-15 ENCOUNTER — Ambulatory Visit (INDEPENDENT_AMBULATORY_CARE_PROVIDER_SITE_OTHER): Payer: BC Managed Care – PPO | Admitting: Psychology

## 2019-04-15 DIAGNOSIS — F331 Major depressive disorder, recurrent, moderate: Secondary | ICD-10-CM | POA: Diagnosis not present

## 2019-05-07 ENCOUNTER — Ambulatory Visit (INDEPENDENT_AMBULATORY_CARE_PROVIDER_SITE_OTHER): Payer: BC Managed Care – PPO | Admitting: Psychology

## 2019-05-07 DIAGNOSIS — F331 Major depressive disorder, recurrent, moderate: Secondary | ICD-10-CM | POA: Diagnosis not present

## 2019-06-09 ENCOUNTER — Ambulatory Visit (INDEPENDENT_AMBULATORY_CARE_PROVIDER_SITE_OTHER): Payer: Self-pay | Admitting: Psychology

## 2019-06-18 ENCOUNTER — Ambulatory Visit (INDEPENDENT_AMBULATORY_CARE_PROVIDER_SITE_OTHER): Payer: BC Managed Care – PPO | Admitting: Psychology

## 2019-06-18 DIAGNOSIS — F331 Major depressive disorder, recurrent, moderate: Secondary | ICD-10-CM | POA: Diagnosis not present

## 2019-06-22 DIAGNOSIS — Z1159 Encounter for screening for other viral diseases: Secondary | ICD-10-CM | POA: Diagnosis not present

## 2019-06-22 DIAGNOSIS — E663 Overweight: Secondary | ICD-10-CM | POA: Diagnosis not present

## 2019-06-22 DIAGNOSIS — R05 Cough: Secondary | ICD-10-CM | POA: Diagnosis not present

## 2019-06-22 DIAGNOSIS — E119 Type 2 diabetes mellitus without complications: Secondary | ICD-10-CM | POA: Diagnosis not present

## 2019-06-30 ENCOUNTER — Encounter: Payer: Self-pay | Admitting: Family Medicine

## 2019-07-09 ENCOUNTER — Ambulatory Visit: Payer: BC Managed Care – PPO

## 2019-07-22 ENCOUNTER — Ambulatory Visit (INDEPENDENT_AMBULATORY_CARE_PROVIDER_SITE_OTHER): Payer: BC Managed Care – PPO | Admitting: Psychology

## 2019-07-22 DIAGNOSIS — F331 Major depressive disorder, recurrent, moderate: Secondary | ICD-10-CM

## 2019-08-02 DIAGNOSIS — R0981 Nasal congestion: Secondary | ICD-10-CM | POA: Diagnosis not present

## 2019-08-02 DIAGNOSIS — Z20822 Contact with and (suspected) exposure to covid-19: Secondary | ICD-10-CM | POA: Diagnosis not present

## 2019-08-02 DIAGNOSIS — R05 Cough: Secondary | ICD-10-CM | POA: Diagnosis not present

## 2019-08-03 ENCOUNTER — Telehealth: Payer: Self-pay | Admitting: Family Medicine

## 2019-08-03 NOTE — Telephone Encounter (Signed)
Called patient to confirm her appointment for tomorrow and she states that she was diagnosed with bronchitis and asked if we could make her new patient appointment virtual.

## 2019-08-03 NOTE — Telephone Encounter (Signed)
Okay to schedule virtual visit.

## 2019-08-04 ENCOUNTER — Ambulatory Visit: Payer: BC Managed Care – PPO | Admitting: Physician Assistant

## 2019-08-04 ENCOUNTER — Other Ambulatory Visit: Payer: Self-pay

## 2019-08-04 ENCOUNTER — Other Ambulatory Visit: Payer: Self-pay | Admitting: Physician Assistant

## 2019-08-04 ENCOUNTER — Encounter: Payer: Self-pay | Admitting: Physician Assistant

## 2019-08-04 VITALS — BP 140/86 | HR 83 | Temp 97.9°F | Ht 63.5 in | Wt 180.2 lb

## 2019-08-04 DIAGNOSIS — R41840 Attention and concentration deficit: Secondary | ICD-10-CM

## 2019-08-04 DIAGNOSIS — R2231 Localized swelling, mass and lump, right upper limb: Secondary | ICD-10-CM | POA: Diagnosis not present

## 2019-08-04 DIAGNOSIS — N898 Other specified noninflammatory disorders of vagina: Secondary | ICD-10-CM

## 2019-08-04 DIAGNOSIS — E785 Hyperlipidemia, unspecified: Secondary | ICD-10-CM | POA: Diagnosis not present

## 2019-08-04 DIAGNOSIS — E119 Type 2 diabetes mellitus without complications: Secondary | ICD-10-CM

## 2019-08-04 DIAGNOSIS — F418 Other specified anxiety disorders: Secondary | ICD-10-CM

## 2019-08-04 LAB — CBC WITH DIFFERENTIAL/PLATELET
Basophils Absolute: 0 10*3/uL (ref 0.0–0.1)
Basophils Relative: 0.5 % (ref 0.0–3.0)
Eosinophils Absolute: 0.2 10*3/uL (ref 0.0–0.7)
Eosinophils Relative: 2.9 % (ref 0.0–5.0)
HCT: 41.5 % (ref 36.0–46.0)
Hemoglobin: 14.1 g/dL (ref 12.0–15.0)
Lymphocytes Relative: 38.5 % (ref 12.0–46.0)
Lymphs Abs: 3.3 10*3/uL (ref 0.7–4.0)
MCHC: 33.9 g/dL (ref 30.0–36.0)
MCV: 91.6 fl (ref 78.0–100.0)
Monocytes Absolute: 0.6 10*3/uL (ref 0.1–1.0)
Monocytes Relative: 6.6 % (ref 3.0–12.0)
Neutro Abs: 4.4 10*3/uL (ref 1.4–7.7)
Neutrophils Relative %: 51.5 % (ref 43.0–77.0)
Platelets: 288 10*3/uL (ref 150.0–400.0)
RBC: 4.53 Mil/uL (ref 3.87–5.11)
RDW: 13 % (ref 11.5–15.5)
WBC: 8.5 10*3/uL (ref 4.0–10.5)

## 2019-08-04 LAB — LIPID PANEL
Cholesterol: 190 mg/dL (ref 0–200)
HDL: 53.4 mg/dL (ref >39.00)
LDL Cholesterol: 106 mg/dL — ABNORMAL HIGH (ref 0–99)
NonHDL: 136.31
Total CHOL/HDL Ratio: 4
Triglycerides: 151 mg/dL — ABNORMAL HIGH (ref 0.0–149.0)
VLDL: 30.2 mg/dL (ref 0.0–40.0)

## 2019-08-04 LAB — COMPREHENSIVE METABOLIC PANEL
ALT: 12 U/L (ref 0–35)
AST: 14 U/L (ref 0–37)
Albumin: 4.4 g/dL (ref 3.5–5.2)
Alkaline Phosphatase: 72 U/L (ref 39–117)
BUN: 12 mg/dL (ref 6–23)
CO2: 27 mEq/L (ref 19–32)
Calcium: 9.9 mg/dL (ref 8.4–10.5)
Chloride: 100 mEq/L (ref 96–112)
Creatinine, Ser: 0.71 mg/dL (ref 0.40–1.20)
GFR: 90.61 mL/min (ref >60.00)
Glucose, Bld: 161 mg/dL — ABNORMAL HIGH (ref 70–99)
Potassium: 4.3 mEq/L (ref 3.5–5.1)
Sodium: 136 mEq/L (ref 135–145)
Total Bilirubin: 0.4 mg/dL (ref 0.2–1.2)
Total Protein: 7.7 g/dL (ref 6.0–8.3)

## 2019-08-04 LAB — HEMOGLOBIN A1C: Hgb A1c MFr Bld: 7.4 % — ABNORMAL HIGH (ref 4.6–6.5)

## 2019-08-04 MED ORDER — FLUCONAZOLE 150 MG PO TABS: 150.0000 mg | ORAL_TABLET | Freq: Once | ORAL | 0 refills | Status: AC

## 2019-08-04 NOTE — Progress Notes (Signed)
Mary Howard is a 44 y.o. female here for a new problem.   History of Present Illness:   Chief Complaint  Patient presents with  . Establish Care  . Lump in Rt axilla    HPI   Attention deficit Feels like she is having a difficult time working from home and has to spend significant amount of time collecting thoughts as she gets ready for the day. Watching videos on YouTube to try to teach herself how to multitask. Feels like she gets get easily frustrated and would like evaluation for ADHD.  Lump Pt c/o lump in right axilla x 1.5 month, size of a pearl, hard nodule. No redness, no pain. Breastfed both daughters, occasionally will have leakage (maybe a drop or two) with stimulation and if around an infant, ever since she stopped breastfeeding.  HLD Stopped lipitor about 2 months ago. No specific reason why.  DM Was exercising regularly. 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 Had [place] in December and got out of healthy routine. Last HgbA1c was 7.0 approximately 4 months ago. Was taking metformin 500 mg BID, did cause diarrhea. Stopped it about 2 months ago. Avoids sweetened beverages.   Depression/Anxiety Took buspar prn. When she took it, it caused a bit of sedation for the first 20 minutes after administration. Feels much better now that she is in a much less stressful job. Denies SI/HI.  Vaginal itching History of yeast infection. No vaginal discharge. Has cream at the house that she has used.   Past Medical History:  Diagnosis Date  . Anxiety   . Depression   . Diabetes mellitus without complication ([place])   . Hyperlipidemia      Social History   Socioeconomic History  . Marital status: Divorced    Spouse name: Not on file  . Number of children: Not on file  . Years of education: Not on file  . Highest education level: Not on file  Occupational History  . Not on file  Tobacco Use  . Smoking status: Current Some Day Smoker    Packs/day: 0.25    Years: 16.00    Pack years:  4.00  . Smokeless tobacco: Never Used  Substance and Sexual Activity  . Alcohol use: Yes    Comment: 2-3 glasses of wine a month  . Drug use: Never  . Sexual activity: Yes    Birth control/protection: Surgical, Condom  Other Topics Concern  . Not on file  Social History Narrative   Works from home   Social Determinants of Radio broadcast assistant Strain:   . Difficulty of Paying Living Expenses: Not on file  Food Insecurity:   . Worried About Charity fundraiser in the Last Year: Not on file  . Ran Out of Food in the Last Year: Not on file  Transportation Needs:   . Lack of Transportation (Medical): Not on file  . Lack of Transportation (Non-Medical): Not on file  Physical Activity:   . Days of Exercise per Week: Not on file  . Minutes of Exercise per Session: Not on file  Stress:   . Feeling of Stress : Not on file  Social Connections:   . Frequency of Communication with Friends and Family: Not on file  . Frequency of Social Gatherings with Friends and Family: Not on file  . Attends Religious Services: Not on file  . Active Member of Clubs or Organizations: Not on file  . Attends Archivist Meetings: Not on file  .  Marital Status: Not on file  Intimate Partner Violence:   . Fear of Current or Ex-Partner: Not on file  . Emotionally Abused: Not on file  . Physically Abused: Not on file  . Sexually Abused: Not on file    Past Surgical History:  Procedure Laterality Date  . ABDOMINAL HYSTERECTOMY    . CHOLECYSTECTOMY      Family History  Problem Relation Age of Onset  . Diabetes Mother   . Bipolar disorder Mother   . Hypertension Mother   . Hyperlipidemia Father   . Heart disease Father   . Stroke Father   . Cancer Paternal Uncle        possibly stomach  . Colon cancer Paternal Grandmother 24  . Cancer Paternal Grandfather        in chest  . Bipolar disorder Sister     No Known Allergies  Current Medications:   Current Outpatient  Medications:  .  albuterol (VENTOLIN HFA) 108 (90 Base) MCG/ACT inhaler, Inhale into the lungs., Disp: , Rfl:  .  azithromycin (ZITHROMAX) 250 MG tablet, Take two tablets today then one tablet daily for 4 days, Disp: , Rfl:  .  traZODone (DESYREL) 50 MG tablet, Take 0.5-1 tablets (25-50 mg total) by mouth at bedtime as needed for sleep., Disp: 30 tablet, Rfl: 3 .  atorvastatin (LIPITOR) 20 MG tablet, Take 1 tablet (20 mg total) by mouth daily. (Patient not taking: Reported on 08/04/2019), Disp: 90 tablet, Rfl: 3 .  busPIRone (BUSPAR) 15 MG tablet, Take 1 tablet (15 mg total) by mouth 2 (two) times daily. (Patient not taking: Reported on 08/04/2019), Disp: 60 tablet, Rfl: 1 .  fluconazole (DIFLUCAN) 150 MG tablet, Take 1 tablet (150 mg total) by mouth once for 1 dose., Disp: 1 tablet, Rfl: 0 .  metFORMIN (GLUCOPHAGE) 500 MG tablet, Take 1 tablet (500 mg total) by mouth 2 (two) times daily with a meal. (Patient not taking: Reported on 08/04/2019), Disp: 180 tablet, Rfl: 3   Review of Systems:   ROS Negative unless otherwise specified per HPI.  Vitals:   Vitals:   08/04/19 0942  BP: 140/86  Pulse: 83  Temp: 97.9 F (36.6 C)  TempSrc: Temporal  SpO2: 97%  Weight: 180 lb 4 oz (81.8 kg)  Height: 5' 3.5" (1.613 m)     Body mass index is 31.43 kg/m.  Physical Exam:   Physical Exam Vitals and nursing note reviewed.  Constitutional:      General: She is not in acute distress.    Appearance: She is well-developed. She is not ill-appearing or toxic-appearing.  Cardiovascular:     Rate and Rhythm: Normal rate and regular rhythm.     Pulses: Normal pulses.     Heart sounds: Normal heart sounds, S1 normal and S2 normal.     Comments: No LE edema Pulmonary:     Effort: Pulmonary effort is normal.     Breath sounds: Normal breath sounds.  Lymphadenopathy:     Upper Body:     Right upper body: Axillary adenopathy (small, fixed, not TTP) present.  Skin:    General: Skin is warm and dry.   Neurological:     Mental Status: She is alert.     GCS: GCS eye subscore is 4. GCS verbal subscore is 5. GCS motor subscore is 6.  Psychiatric:        Speech: Speech normal.        Behavior: Behavior normal. Behavior is cooperative.  Assessment and Plan:   Maybree was seen today for establish care and lump in rt axilla.  Diagnoses and all orders for this visit:  Attention or concentration deficit -     Ambulatory referral to Psychiatry  Lump in armpit, right Referral to breast center for further evaluation given duration of symptoms and positive family hx breast cancer. -     CBC with Differential/Platelet -     Comprehensive metabolic panel -     US BREAST LTD UNI RIGHT INC AXILLA; Future  Hyperlipidemia, unspecified hyperlipidemia type Update lipid panel, likely resume statin. -     Lipid panel  Type 2 diabetes mellitus without complication, without long-term current use of insulin (HCC) Update HgbA1c, will recommend medications based on lab results. Tolerated metformin overall without issues. -     Hemoglobin A1c  Depression with anxiety No red flags. Declines need for medication at this time, continue to monitor.  Vaginal itching Will empirically treat with diflucan, follow-up if symptoms persist despite treatment.  Other orders -     fluconazole (DIFLUCAN) 150 MG tablet; Take 1 tablet (150 mg total) by mouth once for 1 dose.  . Reviewed expectations re: course of current medical issues. . Discussed self-management of symptoms. . Outlined signs and symptoms indicating need for more acute intervention. . Patient verbalized understanding and all questions were answered. . See orders for this visit as documented in the electronic medical record. . Patient received an After-Visit Summary.  This appointment required 45 minutes of patient care (this includes precharting, chart review, review of results, face-to-face care, etc.).   Jarold Motto, PA-C

## 2019-08-04 NOTE — Patient Instructions (Signed)
It was great to see you!  You will be contacted about imaging for your armpit and breast.  You will be contacted about referral to [facility].  We will be in touch via MyChart with lab results and medication recommendations.  Let's follow-up in 3 months, sooner if you have concerns.  Take care,  Jarold Motto PA-C

## 2019-08-13 ENCOUNTER — Ambulatory Visit (INDEPENDENT_AMBULATORY_CARE_PROVIDER_SITE_OTHER): Payer: BC Managed Care – PPO | Admitting: Psychology

## 2019-08-13 DIAGNOSIS — F331 Major depressive disorder, recurrent, moderate: Secondary | ICD-10-CM

## 2019-08-17 ENCOUNTER — Ambulatory Visit
Admission: RE | Admit: 2019-08-17 | Discharge: 2019-08-17 | Disposition: A | Discharge status: Home / Self Care | Payer: BC Managed Care – PPO | Source: Ambulatory Visit | Attending: Physician Assistant | Admitting: Physician Assistant

## 2019-08-17 ENCOUNTER — Other Ambulatory Visit: Payer: Self-pay

## 2019-08-17 DIAGNOSIS — R922 Inconclusive mammogram: Secondary | ICD-10-CM | POA: Diagnosis not present

## 2019-08-17 DIAGNOSIS — N6001 Solitary cyst of right breast: Secondary | ICD-10-CM | POA: Diagnosis not present

## 2019-08-17 DIAGNOSIS — R2231 Localized swelling, mass and lump, right upper limb: Secondary | ICD-10-CM

## 2019-08-20 ENCOUNTER — Ambulatory Visit: Payer: BC Managed Care – PPO | Admitting: Psychology

## 2019-09-11 DIAGNOSIS — Z79899 Other long term (current) drug therapy: Secondary | ICD-10-CM | POA: Diagnosis not present

## 2019-09-11 DIAGNOSIS — F419 Anxiety disorder, unspecified: Secondary | ICD-10-CM | POA: Diagnosis not present

## 2019-09-11 DIAGNOSIS — F902 Attention-deficit hyperactivity disorder, combined type: Secondary | ICD-10-CM | POA: Diagnosis not present

## 2019-09-11 DIAGNOSIS — R41840 Attention and concentration deficit: Secondary | ICD-10-CM | POA: Diagnosis not present

## 2019-09-16 ENCOUNTER — Encounter: Payer: Self-pay | Admitting: Physician Assistant

## 2019-09-17 ENCOUNTER — Ambulatory Visit: Payer: BC Managed Care – PPO | Admitting: Psychology

## 2019-10-07 ENCOUNTER — Encounter: Payer: Self-pay | Admitting: Physician Assistant

## 2019-10-07 ENCOUNTER — Other Ambulatory Visit: Payer: Self-pay

## 2019-10-07 ENCOUNTER — Ambulatory Visit (INDEPENDENT_AMBULATORY_CARE_PROVIDER_SITE_OTHER): Payer: BC Managed Care – PPO | Admitting: Physician Assistant

## 2019-10-07 VITALS — BP 140/80 | HR 112 | Temp 98.5°F | Ht 63.5 in | Wt 181.4 lb

## 2019-10-07 DIAGNOSIS — G4733 Obstructive sleep apnea (adult) (pediatric): Secondary | ICD-10-CM | POA: Diagnosis not present

## 2019-10-07 NOTE — Progress Notes (Signed)
Mary Howard is a 44 y.o. female is here to discuss: Getting a sleep study  I acted as a Education administrator for [employer], PA-C Mary Pickler, LPN  History of Present Illness:   Chief Complaint  Patient presents with  . Sleep Apnea    HPI   Sleep apnea Pt was dx 15-20 years ago with sleep apnea. She used to have a machine. Pt has not used machine in at least 15 years. Pt is having problems with snoring, insomnia. Pt would like to have a sleep study done so she can get back on a CPAP machine.  She is waking herself up with snoring.  There are no preventive care reminders to display for this patient.  Past Medical History:  Diagnosis Date  . Anxiety   . Depression   . Diabetes mellitus without complication ([place])   . Hyperlipidemia      Social History   Socioeconomic History  . Marital status: Divorced    Spouse name: Not on file  . Number of children: Not on file  . Years of education: Not on file  . Highest education level: Not on file  Occupational History  . Not on file  Tobacco Use  . Smoking status: Current Some Day Smoker    Packs/day: 0.25    Years: 16.00    Pack years: 4.00  . Smokeless tobacco: Never Used  Substance and Sexual Activity  . Alcohol use: Yes    Comment: 2-3 glasses of wine a month  . Drug use: Never  . Sexual activity: Yes    Birth control/protection: Surgical, Condom  Other Topics Concern  . Not on file  Social History Narrative   Works from home   Social Determinants of Radio broadcast assistant Strain:   . Difficulty of Paying Living Expenses:   Food Insecurity:   . Worried About Charity fundraiser in the Last Year:   . Arboriculturist in the Last Year:   Transportation Needs:   . Film/video editor (Medical):   Marland Kitchen Lack of Transportation (Non-Medical):   Physical Activity:   . Days of Exercise per Week:   . Minutes of Exercise per Session:   Stress:   . Feeling of Stress :   Social Connections:   . Frequency of  Communication with Friends and Family:   . Frequency of Social Gatherings with Friends and Family:   . Attends Religious Services:   . Active Member of Clubs or Organizations:   . Attends Archivist Meetings:   Marland Kitchen Marital Status:   Intimate Partner Violence:   . Fear of Current or Ex-Partner:   . Emotionally Abused:   Marland Kitchen Physically Abused:   . Sexually Abused:     Past Surgical History:  Procedure Laterality Date  . ABDOMINAL HYSTERECTOMY    . CHOLECYSTECTOMY      Family History  Problem Relation Age of Onset  . Diabetes Mother   . Bipolar disorder Mother   . Hypertension Mother   . Hyperlipidemia Father   . Heart disease Father   . Stroke Father   . Cancer Paternal Uncle        possibly stomach  . Colon cancer Paternal Grandmother 9  . Cancer Paternal Grandfather        in chest  . Bipolar disorder Sister     PMHx, SurgHx, SocialHx, FamHx, Medications, and Allergies were reviewed in the Visit Navigator and updated as appropriate.   Patient  Active Problem List   Diagnosis Date Noted  . Hx of diabetes mellitus 04/11/2018  . BMI 33.0-33.9,adult 04/11/2018    Social History   Tobacco Use  . Smoking status: Current Some Day Smoker    Packs/day: 0.25    Years: 16.00    Pack years: 4.00  . Smokeless tobacco: Never Used  Substance Use Topics  . Alcohol use: Yes    Comment: 2-3 glasses of wine a month  . Drug use: Never    Current Medications and Allergies:    Current Outpatient Medications:  .  albuterol (VENTOLIN HFA) 108 (90 Base) MCG/ACT inhaler, Inhale into the lungs., Disp: , Rfl:  .  amphetamine-dextroamphetamine (ADDERALL XR) 30 MG 24 hr capsule, Take 30 mg by mouth daily. Prescribed by [facility], Disp: , Rfl:  .  atorvastatin (LIPITOR) 20 MG tablet, Take 1 tablet (20 mg total) by mouth daily., Disp: 90 tablet, Rfl: 3 .  metFORMIN (GLUCOPHAGE) 500 MG tablet, Take 1 tablet (500 mg total) by mouth 2 (two) times daily with a  meal., Disp: 180 tablet, Rfl: 3 .  traZODone (DESYREL) 50 MG tablet, Take 0.5-1 tablets (25-50 mg total) by mouth at bedtime as needed for sleep., Disp: 30 tablet, Rfl: 3  No Known Allergies  Review of Systems   ROS  Negative unless otherwise specified per HPI.  Vitals:   Vitals:   10/07/19 1430  BP: 140/80  Pulse: (!) 112  Temp: 98.5 F (36.9 C)  TempSrc: Temporal  SpO2: 96%  Weight: 181 lb 6.1 oz (82.3 kg)  Height: 5' 3.5" (1.613 m)     Body mass index is 31.63 kg/m.   Physical Exam:    Physical Exam Vitals and nursing note reviewed.  Constitutional:      General: She is not in acute distress.    Appearance: She is well-developed. She is not ill-appearing or toxic-appearing.  Cardiovascular:     Rate and Rhythm: Normal rate and regular rhythm.     Pulses: Normal pulses.     Heart sounds: Normal heart sounds, S1 normal and S2 normal.     Comments: No LE edema Pulmonary:     Effort: Pulmonary effort is normal.     Breath sounds: Normal breath sounds.  Skin:    General: Skin is warm and dry.  Neurological:     Mental Status: She is alert.     GCS: GCS eye subscore is 4. GCS verbal subscore is 5. GCS motor subscore is 6.  Psychiatric:        Speech: Speech normal.        Behavior: Behavior normal. Behavior is cooperative.      Assessment and Plan:    Mary Howard was seen today for sleep apnea.  Diagnoses and all orders for this visit:  OSA (obstructive sleep apnea)   Referral placed for sleep study.   . Reviewed expectations re: course of current medical issues. . Discussed self-management of symptoms. . Outlined signs and symptoms indicating need for more acute intervention. . Patient verbalized understanding and all questions were answered. . See orders for this visit as documented in the electronic medical record. . Patient received an After Visit Summary.  CMA or LPN served as scribe during this visit. History, Physical, and Plan performed by  medical provider. The above documentation has been reviewed and is accurate and complete.  Mary Motto, PA-C [place] [place] 10/07/2019  Follow-up: No follow-ups on file.

## 2019-10-07 NOTE — Addendum Note (Signed)
Addended byGracy Racer on: 10/07/2019 03:13 PM   Modules accepted: Orders

## 2019-10-07 NOTE — Patient Instructions (Signed)
It was great to see you!  You will be contacted about your sleep study.  Take care,  Jarold Motto PA-C

## 2019-10-20 ENCOUNTER — Institutional Professional Consult (permissible substitution): Payer: Self-pay | Admitting: Neurology

## 2019-10-29 DIAGNOSIS — G4733 Obstructive sleep apnea (adult) (pediatric): Secondary | ICD-10-CM | POA: Diagnosis not present

## 2019-10-29 DIAGNOSIS — F902 Attention-deficit hyperactivity disorder, combined type: Secondary | ICD-10-CM | POA: Diagnosis not present

## 2019-10-29 DIAGNOSIS — Z79899 Other long term (current) drug therapy: Secondary | ICD-10-CM | POA: Diagnosis not present

## 2019-11-02 ENCOUNTER — Encounter: Payer: Self-pay | Admitting: Neurology

## 2019-11-02 ENCOUNTER — Ambulatory Visit (INDEPENDENT_AMBULATORY_CARE_PROVIDER_SITE_OTHER): Payer: BC Managed Care – PPO | Admitting: Neurology

## 2019-11-02 ENCOUNTER — Other Ambulatory Visit: Payer: Self-pay

## 2019-11-02 VITALS — BP 138/87 | HR 86 | Temp 97.2°F | Ht 62.0 in | Wt 176.0 lb

## 2019-11-02 DIAGNOSIS — E669 Obesity, unspecified: Secondary | ICD-10-CM

## 2019-11-02 DIAGNOSIS — IMO0001 Reserved for inherently not codable concepts without codable children: Secondary | ICD-10-CM

## 2019-11-02 DIAGNOSIS — R519 Headache, unspecified: Secondary | ICD-10-CM

## 2019-11-02 DIAGNOSIS — G4719 Other hypersomnia: Secondary | ICD-10-CM

## 2019-11-02 DIAGNOSIS — R634 Abnormal weight loss: Secondary | ICD-10-CM

## 2019-11-02 DIAGNOSIS — Z820 Family history of epilepsy and other diseases of the nervous system: Secondary | ICD-10-CM

## 2019-11-02 DIAGNOSIS — G4733 Obstructive sleep apnea (adult) (pediatric): Secondary | ICD-10-CM | POA: Diagnosis not present

## 2019-11-02 DIAGNOSIS — F17200 Nicotine dependence, unspecified, uncomplicated: Secondary | ICD-10-CM

## 2019-11-02 NOTE — Progress Notes (Signed)
Subjective:    Patient ID: Mary Howard is a 44 y.o. female.  HPI     Mary Foley, MD, PhD [facility] [address], [address] [address] [place], [place] [address]  Dear Mary Howard,  I saw your patient, Mary Howard, upon your kind request, in my sleep clinic today for initial consultation of her sleep disorder, in particular, evaluation of her prior diagnosis of OSA.  The patient is unaccompanied today.  As you know, Mary Howard is a 44 year old right-handed woman with an underlying medical history of anxiety, depression, diabetes, hyperlipidemia, smoking, asthma and obesity, who was previously diagnosed with obstructive sleep apnea several years ago and placed on CPAP therapy.  She has not used her CPAP in many years, prior sleep study results or a CPAP compliance download are not available for my review today.  I reviewed your office note from 10/07/2019.  Her Epworth sleepiness score is 12 out of 24, fatigue severity score is 45 out of 63. Sleep study testing was about 10 to 15 years ago when she weighed more at the time, maximum weight was somewhere around 250 or 260 pounds in the past.  She has been working on weight loss.  She snores, she does have daytime somnolence and has woken up with a headache at times. Her bedtime is generally between 10 and 11 and rise time between 6 and 6:30 AM.  She drinks alcohol occasionally or rarely, 2-4 times a month on average, no caffeine daily, and is currently working on smoking reduction, currently smokes about 5 to 8 cigarettes/day on average.  She lives with her mother, sister, and sisters for children.  Patient herself has 2 grown daughters, ages 53 and 76.  She is divorced, works in Consulting civil engineer.  She tried CPAP altogether for about a year, had trouble tolerating the mask, had soreness and nasal bleeds from the nasal pillows and had soreness on the face, by the nose, from using a full facemask.  Nevertheless, she would be willing to get  rechecked and consider CPAP therapy again. They have 3 cats in the household, 1 cat is typically in her bedroom.  She does not watch TV in the bedroom.  She has had occasional morning headaches, no night to night nocturia.  Her mom has a CPAP machine.  Her Past Medical History Is Significant For: Past Medical History:  Diagnosis Date  . Anxiety   . Depression   . Diabetes mellitus without complication ([facility])   . Hyperlipidemia     Her Past Surgical History Is Significant For: Past Surgical History:  Procedure Laterality Date  . ABDOMINAL HYSTERECTOMY    . CHOLECYSTECTOMY      Her Family History Is Significant For: Family History  Problem Relation Howard of Onset  . Diabetes Mother   . Bipolar disorder Mother   . Hypertension Mother   . Hyperlipidemia Father   . Heart disease Father   . Stroke Father   . Cancer Paternal Uncle        possibly stomach  . Colon cancer Paternal Grandmother 28  . Cancer Paternal Grandfather        in chest  . Bipolar disorder Sister     Her Social History Is Significant For: Social History   Socioeconomic History  . Marital status: Divorced    Spouse name: Not on file  . Number of children: Not on file  . Years of education: Not on file  . Highest education level: Not on file  Occupational History  . Not on file  Tobacco Use  . Smoking status: Current Some Day Smoker    Packs/day: 0.25    Years: 16.00    Pack years: 4.00  . Smokeless tobacco: Never Used  Substance and Sexual Activity  . Alcohol use: Yes    Comment: 2-3 glasses of wine a month  . Drug use: Never  . Sexual activity: Yes    Birth control/protection: Surgical, Condom  Other Topics Concern  . Not on file  Social History Narrative   Works from home   Social Determinants of Corporate investment banker Strain:   . Difficulty of Paying Living Expenses:   Food Insecurity:   . Worried About Programme researcher, broadcasting/film/video in the Last Year:   . Barista in the Last Year:    Transportation Needs:   . Freight forwarder (Medical):   Marland Kitchen Lack of Transportation (Non-Medical):   Physical Activity:   . Days of Exercise per Week:   . Minutes of Exercise per Session:   Stress:   . Feeling of Stress :   Social Connections:   . Frequency of Communication with Friends and Family:   . Frequency of Social Gatherings with Friends and Family:   . Attends Religious Services:   . Active Member of Clubs or Organizations:   . Attends Banker Meetings:   Marland Kitchen Marital Status:     Her Allergies Are:  No Known Allergies:   Her Current Medications Are:  Outpatient Encounter Medications as of 11/02/2019  Medication Sig  . albuterol (VENTOLIN HFA) 108 (90 Base) MCG/ACT inhaler Inhale into the lungs.  Marland Kitchen amphetamine-dextroamphetamine (ADDERALL) 30 MG tablet Take 30 mg by mouth daily. 1 capsule every day  . atorvastatin (LIPITOR) 20 MG tablet Take 1 tablet (20 mg total) by mouth daily.  . metFORMIN (GLUCOPHAGE) 500 MG tablet Take 1 tablet (500 mg total) by mouth 2 (two) times daily with a meal.  . traZODone (DESYREL) 50 MG tablet Take 0.5-1 tablets (25-50 mg total) by mouth at bedtime as needed for sleep.  . [DISCONTINUED] Amphetamine-Dextroamphetamine (AMPHETAMINE SALT COMBO PO) Take 20 mg by mouth daily. Take 1 and a half tablet every day   No facility-administered encounter medications on file as of 11/02/2019.  :  Review of Systems:  Out of a complete 14 point review of systems, all are reviewed and negative with the exception of these symptoms as listed below: Review of Systems  Neurological:       Here for sleep consult. Prior sleep study 10-15 years ago.  Reports she tried cpap off and on for a year but ended up d/c.  Pt reports she does snore and wakes in the mornings not feeling rested. She did mention ruling out if she has a deviated septum.  Epworth Sleepiness Scale 0= would never doze 1= slight chance of dozing 2= moderate chance of dozing 3= high  chance of dozing  Sitting and reading:1 Watching TV:2 Sitting inactive in a public place (ex. Theater or meeting):1 As a passenger in a car for an hour without a break:2 Lying down to rest in the afternoon:3 Sitting and talking to someone:0 Sitting quietly after lunch (no alcohol):3 In a car, while stopped in traffic:0 Total:12      Objective:  Neurological Exam  Physical Exam Physical Examination:   Vitals:   11/02/19 1126  BP: 138/87  Pulse: 86  Temp: (!) 97.2 F (36.2 C)  General Examination: The patient is a very pleasant 44 y.o. female in no acute distress. She appears well-developed and well-nourished and well groomed.   HEENT: Normocephalic, atraumatic, pupils are equal, round and reactive to light, extraocular tracking is good without limitation to gaze excursion or nystagmus noted. Hearing is grossly intact. Face is symmetric with normal facial animation. Speech is clear with no dysarthria noted. There is no hypophonia. There is no lip, neck/head, jaw or voice tremor. Neck is supple with full range of passive and active motion. There are no carotid bruits on auscultation. Oropharynx exam reveals: mild mouth dryness, good dental hygiene and mild airway crowding, due to smaller airway entry, tonsils 1+, L side easier to see. Mallampati is class I. Tongue protrudes centrally and palate elevates symmetrically. Neck 16.25 inches. Slightly deviated nasal septum to L.   Chest: Clear to auscultation without wheezing, rhonchi or crackles noted.  Heart: S1+S2+0, regular and normal without murmurs, rubs or gallops noted.   Abdomen: Soft, non-tender and non-distended with normal bowel sounds appreciated on auscultation.  Extremities: There is no pitting edema in the distal lower extremities bilaterally.   Skin: Warm and dry without trophic changes noted.   Musculoskeletal: exam reveals no obvious joint deformities, tenderness or joint swelling or erythema.    Neurologically:  Mental status: The patient is awake, alert and oriented in all 4 spheres. Her immediate and remote memory, attention, language skills and fund of knowledge are appropriate. There is no evidence of aphasia, agnosia, apraxia or anomia. Speech is clear with normal prosody and enunciation. Thought process is linear. Mood is normal and affect is normal.  Cranial nerves II - XII are as described above under HEENT exam.  Motor exam: Normal bulk, strength and tone is noted. There is no tremor, Romberg is negative. Fine motor skills and coordination: grossly intact.  Cerebellar testing: No dysmetria or intention tremor. There is no truncal or gait ataxia.  Sensory exam: intact to light touch in the upper and lower extremities.  Gait, station and balance: She stands easily. No veering to one side is noted. No leaning to one side is noted. Posture is Howard-appropriate and stance is narrow based. Gait shows normal stride length and normal pace. No problems turning are noted. Tandem walk is unremarkable.                Assessment and Plan:  In summary, Carrye Goller is a very pleasant 44 y.o.-year old female with an underlying medical history of anxiety, depression, diabetes, hyperlipidemia, smoking, asthma and obesity, who presents for evaluation of her obstructive sleep apnea.  She carries a diagnosis of OSA from 10 or 15 years ago.  She was much heavier at the time but still has snoring and daytime somnolence, occasional morning headaches.  She has a family history of sleep apnea as well.  Her history and exam are not still concerning for the possibility of underlying sleep disordered breathing.   I had a long chat with the patient about my findings and the diagnosis of OSA, its prognosis and treatment options. We talked about medical treatments, surgical interventions and non-pharmacological approaches. I explained in particular the risks and ramifications of untreated moderate to severe OSA,  especially with respect to developing cardiovascular disease down the Road, including congestive heart failure, difficult to treat hypertension, cardiac arrhythmias, or stroke. Even type 2 diabetes has, in part, been linked to untreated OSA. Symptoms of untreated OSA include daytime sleepiness, memory problems, mood irritability and mood disorder  such as depression and anxiety, lack of energy, as well as recurrent headaches, especially morning headaches. We talked about smoking cessation and trying to maintain a healthy lifestyle in general, as well as the importance of weight control. We also talked about the importance of good sleep hygiene. I recommended the following at this time: sleep study.   I explained the sleep test procedure to the patient and also outlined possible surgical and non-surgical treatment options of OSA, including the use of a custom-made dental device (which would require a referral to a specialist dentist or oral surgeon), upper airway surgical options, such as traditional UPPP or a novel less invasive surgical option in the form of Inspire hypoglossal nerve stimulation (which would involve a referral to an ENT surgeon). I also explained the CPAP treatment option to the patient, who indicated that she would be willing to try CPAP if the need arises. I explained the importance of being compliant with PAP treatment, not only for insurance purposes but primarily to improve Her symptoms, and for the patient's long term health benefit, including to reduce Her cardiovascular risks. I answered all her questions today and the patient was in agreement. I plan to see her back after the sleep study is completed and encouraged her to call with any interim questions, concerns, problems or updates.   Thank you very much for allowing me to participate in the care of this nice patient. If I can be of any further assistance to you please do not hesitate to call me at  [phone].  Sincerely,   Mary Age, MD, PhD

## 2019-11-02 NOTE — Patient Instructions (Signed)
Thank you for choosing [facility] for your sleep related care! It was nice to meet you today! I appreciate that you entrust me with your sleep related healthcare concerns. I hope, I was able to address at least some of your concerns today, and that I can help you feel reassured and also get better.    Here is what we discussed today and what we came up with as our plan for you:    Based on your symptoms and your exam I believe you are still at risk for obstructive sleep apnea and would benefit from reevaluation as it has been many years and you have had changes in your weight and symptoms. Therefore, I think we should proceed with a sleep study to determine how severe your sleep apnea is. If you have more than mild OSA, I want you to consider ongoing treatment with CPAP. Please remember, the risks and ramifications of moderate to severe obstructive sleep apnea or OSA are: Cardiovascular disease, including congestive heart failure, stroke, difficult to control hypertension, arrhythmias, and even type 2 diabetes has been linked to untreated OSA. Sleep apnea causes disruption of sleep and sleep deprivation in most cases, which, in turn, can cause recurrent headaches, problems with memory, mood, concentration, focus, and vigilance. Most people with untreated sleep apnea report excessive daytime sleepiness, which can affect their ability to drive. Please do not drive if you feel sleepy.   I will likely see you back after your sleep study to go over the test results and where to go from there. We will call you after your sleep study to advise about the results (most likely, you will hear from [place], my nurse) and to set up an appointment at the time, as necessary.    Our sleep lab administrative assistant will call you to schedule your sleep study. If you don't hear back from her by about 2 weeks from now, please feel free to call her at [phone]. You can leave a message with your phone  number and concerns, if you get the voicemail box. She will call back as soon as possible.

## 2019-11-03 ENCOUNTER — Ambulatory Visit: Payer: BC Managed Care – PPO | Admitting: Physician Assistant

## 2019-11-03 DIAGNOSIS — Z0289 Encounter for other administrative examinations: Secondary | ICD-10-CM

## 2019-11-03 NOTE — Progress Notes (Deleted)
Mary Howard is a 44 y.o. female is here to discuss:  SCRIBE STATEMENT  History of Present Illness:   No chief complaint on file.   HPI  Diabetes Pt following up, last A1c was 7.4 3 months ago. Currently taking Metformin 500 mg BID.  Hyperlipidemia Cholesterol elevated labs done 08/2019. Pt restarted Lipitor 20 mg daily at that time.  Anxiety & Depression      Health Maintenance Due  Topic Date Due  . COVID-19 Vaccine (1) Never done    Past Medical History:  Diagnosis Date  . Anxiety   . Depression   . Diabetes mellitus without complication (HCC)   . Hyperlipidemia      Social History   Socioeconomic History  . Marital status: Divorced    Spouse name: Not on file  . Number of children: Not on file  . Years of education: Not on file  . Highest education level: Not on file  Occupational History  . Not on file  Tobacco Use  . Smoking status: Current Some Day Smoker    Packs/day: 0.25    Years: 16.00    Pack years: 4.00  . Smokeless tobacco: Never Used  Substance and Sexual Activity  . Alcohol use: Yes    Comment: 2-3 glasses of wine a month  . Drug use: Never  . Sexual activity: Yes    Birth control/protection: Surgical, Condom  Other Topics Concern  . Not on file  Social History Narrative   Works from home   Social Determinants of Corporate investment banker Strain:   . Difficulty of Paying Living Expenses:   Food Insecurity:   . Worried About Programme researcher, broadcasting/film/video in the Last Year:   . Barista in the Last Year:   Transportation Needs:   . Freight forwarder (Medical):   Marland Kitchen Lack of Transportation (Non-Medical):   Physical Activity:   . Days of Exercise per Week:   . Minutes of Exercise per Session:   Stress:   . Feeling of Stress :   Social Connections:   . Frequency of Communication with Friends and Family:   . Frequency of Social Gatherings with Friends and Family:   . Attends Religious Services:   . Active Member of Clubs  or Organizations:   . Attends Banker Meetings:   Marland Kitchen Marital Status:   Intimate Partner Violence:   . Fear of Current or Ex-Partner:   . Emotionally Abused:   Marland Kitchen Physically Abused:   . Sexually Abused:     Past Surgical History:  Procedure Laterality Date  . ABDOMINAL HYSTERECTOMY    . CHOLECYSTECTOMY      Family History  Problem Relation Age of Onset  . Diabetes Mother   . Bipolar disorder Mother   . Hypertension Mother   . Hyperlipidemia Father   . Heart disease Father   . Stroke Father   . Cancer Paternal Uncle        possibly stomach  . Colon cancer Paternal Grandmother 3  . Cancer Paternal Grandfather        in chest  . Bipolar disorder Sister     PMHx, SurgHx, SocialHx, FamHx, Medications, and Allergies were reviewed in the Visit Navigator and updated as appropriate.   Patient Active Problem List   Diagnosis Date Noted  . Hx of diabetes mellitus 04/11/2018  . BMI 33.0-33.9,adult 04/11/2018    Social History   Tobacco Use  . Smoking status: Current Some  Day Smoker    Packs/day: 0.25    Years: 16.00    Pack years: 4.00  . Smokeless tobacco: Never Used  Substance Use Topics  . Alcohol use: Yes    Comment: 2-3 glasses of wine a month  . Drug use: Never    Current Medications and Allergies:    Current Outpatient Medications:  .  albuterol (VENTOLIN HFA) 108 (90 Base) MCG/ACT inhaler, Inhale into the lungs., Disp: , Rfl:  .  amphetamine-dextroamphetamine (ADDERALL) 30 MG tablet, Take 30 mg by mouth daily. 1 capsule every day, Disp: , Rfl:  .  atorvastatin (LIPITOR) 20 MG tablet, Take 1 tablet (20 mg total) by mouth daily., Disp: 90 tablet, Rfl: 3 .  metFORMIN (GLUCOPHAGE) 500 MG tablet, Take 1 tablet (500 mg total) by mouth 2 (two) times daily with a meal., Disp: 180 tablet, Rfl: 3 .  traZODone (DESYREL) 50 MG tablet, Take 0.5-1 tablets (25-50 mg total) by mouth at bedtime as needed for sleep., Disp: 30 tablet, Rfl: 3  No Known Allergies   Review of Systems   ROS  Vitals:  There were no vitals filed for this visit.   There is no height or weight on file to calculate BMI.   Physical Exam:    Physical Exam   Assessment and Plan:    There are no diagnoses linked to this encounter.  . Reviewed expectations re: course of current medical issues. . Discussed self-management of symptoms. . Outlined signs and symptoms indicating need for more acute intervention. . Patient verbalized understanding and all questions were answered. . See orders for this visit as documented in the electronic medical record. . Patient received an After Visit Summary.  ***  Inda Coke, PA-C [place], [place] [facility] 11/03/2019  Follow-up: No follow-ups on file.

## 2019-12-07 ENCOUNTER — Ambulatory Visit: Payer: BC Managed Care – PPO | Admitting: Neurology

## 2019-12-07 ENCOUNTER — Other Ambulatory Visit: Payer: Self-pay

## 2019-12-07 DIAGNOSIS — G4733 Obstructive sleep apnea (adult) (pediatric): Secondary | ICD-10-CM

## 2019-12-07 DIAGNOSIS — R519 Headache, unspecified: Secondary | ICD-10-CM

## 2019-12-07 DIAGNOSIS — G4719 Other hypersomnia: Secondary | ICD-10-CM

## 2019-12-07 DIAGNOSIS — IMO0001 Reserved for inherently not codable concepts without codable children: Secondary | ICD-10-CM

## 2019-12-07 DIAGNOSIS — F17200 Nicotine dependence, unspecified, uncomplicated: Secondary | ICD-10-CM

## 2019-12-07 DIAGNOSIS — E669 Obesity, unspecified: Secondary | ICD-10-CM

## 2019-12-07 DIAGNOSIS — R634 Abnormal weight loss: Secondary | ICD-10-CM

## 2019-12-07 DIAGNOSIS — Z820 Family history of epilepsy and other diseases of the nervous system: Secondary | ICD-10-CM

## 2019-12-09 DIAGNOSIS — F900 Attention-deficit hyperactivity disorder, predominantly inattentive type: Secondary | ICD-10-CM | POA: Diagnosis not present

## 2019-12-09 DIAGNOSIS — F810 Specific reading disorder: Secondary | ICD-10-CM | POA: Diagnosis not present

## 2019-12-11 ENCOUNTER — Encounter: Payer: Self-pay | Admitting: Physician Assistant

## 2019-12-14 ENCOUNTER — Telehealth: Payer: Self-pay | Admitting: Physician Assistant

## 2019-12-14 ENCOUNTER — Telehealth (INDEPENDENT_AMBULATORY_CARE_PROVIDER_SITE_OTHER): Payer: BC Managed Care – PPO | Admitting: Physician Assistant

## 2019-12-14 ENCOUNTER — Encounter: Payer: Self-pay | Admitting: Physician Assistant

## 2019-12-14 ENCOUNTER — Other Ambulatory Visit: Payer: Self-pay

## 2019-12-14 VITALS — Ht 62.0 in | Wt 178.0 lb

## 2019-12-14 DIAGNOSIS — R05 Cough: Secondary | ICD-10-CM | POA: Diagnosis not present

## 2019-12-14 DIAGNOSIS — R059 Cough, unspecified: Secondary | ICD-10-CM

## 2019-12-14 MED ORDER — AMOXICILLIN-POT CLAVULANATE 875-125 MG PO TABS
1.0000 | ORAL_TABLET | Freq: Two times a day (BID) | ORAL | 0 refills | Status: DC
Start: 2019-12-14 — End: 2019-12-29

## 2019-12-14 MED ORDER — PREDNISONE 20 MG PO TABS: ORAL_TABLET | ORAL | 0 refills | Status: DC

## 2019-12-14 NOTE — Telephone Encounter (Signed)
Noted  

## 2019-12-14 NOTE — Progress Notes (Signed)
Virtual Visit via Video   I connected with Mary Howard on 12/14/19 at 11:30 AM EDT by a video enabled telemedicine application and verified that I am speaking with the correct person using two identifiers. Location patient: Home Location provider: [facility], Office Persons participating in the virtual visit: Mary Howard, [place] PA-C, Anselmo Pickler, LPN   I discussed the limitations of evaluation and management by telemedicine and the availability of in person appointments. The patient expressed understanding and agreed to proceed.  I acted as a Education administrator for [employer], [employer], LPN   Subjective:   HPI:   Cough Pt c/o cough, runny nose and sore throat. Started yesterday. Pt coughing and expectorating green sputum. Nasal congestion with green drainage.  Denies fever or chills, headache or dizziness. She is taking Ibuprofen and Tylenol, halls lozenges. Pt started using Albuterol Inhaler today and it has helped with cough.  December had COVID. Has not had COVID vaccine.  She is a current smoker.   ROS: See pertinent positives and negatives per HPI.  Patient Active Problem List   Diagnosis Date Noted   Hx of diabetes mellitus 04/11/2018   BMI 33.0-33.9,adult 04/11/2018   Type 2 diabetes mellitus with hyperglycemia, without long-term current use of insulin ([place]) 11/06/2016    Social History   Tobacco Use   Smoking status: Current Some Day Smoker    Packs/day: 0.25    Years: 16.00    Pack years: 4.00   Smokeless tobacco: Never Used  Substance Use Topics   Alcohol use: Yes    Comment: 2-3 glasses of wine a month    Current Outpatient Medications:    albuterol (VENTOLIN HFA) 108 (90 Base) MCG/ACT inhaler, Inhale into the lungs., Disp: , Rfl:    amphetamine-dextroamphetamine (ADDERALL) 30 MG tablet, Take 30 mg by mouth daily. 1 capsule every day, Disp: , Rfl:    atorvastatin (LIPITOR) 20 MG tablet, Take 1 tablet (20 mg total) by mouth  daily., Disp: 90 tablet, Rfl: 3   metFORMIN (GLUCOPHAGE) 500 MG tablet, Take 1 tablet (500 mg total) by mouth 2 (two) times daily with a meal., Disp: 180 tablet, Rfl: 3   traZODone (DESYREL) 50 MG tablet, Take 0.5-1 tablets (25-50 mg total) by mouth at bedtime as needed for sleep., Disp: 30 tablet, Rfl: 3   amoxicillin-clavulanate (AUGMENTIN) 875-125 MG tablet, Take 1 tablet by mouth 2 (two) times daily., Disp: 20 tablet, Rfl: 0   predniSONE (DELTASONE) 20 MG tablet, Take 20 mg daily, Disp: 5 tablet, Rfl: 0  No Known Allergies  Objective:   VITALS: Per patient if applicable, see vitals. GENERAL: Alert, appears well and in no acute distress. HEENT: Atraumatic, conjunctiva clear, no obvious abnormalities on inspection of external nose and ears. NECK: Normal movements of the head and neck. CARDIOPULMONARY: No increased WOB. Speaking in clear sentences. I:E ratio WNL.  MS: Moves all visible extremities without noticeable abnormality. PSYCH: Pleasant and cooperative, well-groomed. Speech normal rate and rhythm. Affect is appropriate. Insight and judgement are appropriate. Attention is focused, linear, and appropriate.  NEURO: CN grossly intact. Oriented as arrived to appointment on time with no prompting. Moves both UE equally.  SKIN: No obvious lesions, wounds, erythema, or cyanosis noted on face or hands.  Assessment and Plan:   Mary Howard was seen today for cough.  Diagnoses and all orders for this visit:  Cough No red flags on discussion.  She is diabetic with most recent A1c in the 7's -- did discuss  that we could trial a brief round of low-dose oral prednisone -- did discuss that this may raise her blood sugars, encouraged her to check her blood sugars regularly and if having readings consistently >200's to call us and likely discontinue oral prednisone if symptomatic. She is agreeable to this and verbalized understanding.  Will also provide pocket rx for oral Augmentin should symptoms  worsen or do not improve. Given hx of confirmed COVID-19, we opted to not test her for COVID-19 at this time.  Reviewed return precautions including worsening fever, SOB, worsening cough or other concerns. Push fluids and rest. I recommend that patient follow-up if symptoms worsen or persist despite treatment x 7-10 days, sooner if needed.  Other orders -     predniSONE (DELTASONE) 20 MG tablet; Take 20 mg daily -     amoxicillin-clavulanate (AUGMENTIN) 875-125 MG tablet; Take 1 tablet by mouth 2 (two) times daily.   Reviewed expectations re: course of current medical issues.  Discussed self-management of symptoms.  Outlined signs and symptoms indicating need for more acute intervention.  Patient verbalized understanding and all questions were answered.  Health Maintenance issues including appropriate healthy diet, exercise, and smoking avoidance were discussed with patient.  See orders for this visit as documented in the electronic medical record.  I discussed the assessment and treatment plan with the patient. The patient was provided an opportunity to ask questions and all were answered. The patient agreed with the plan and demonstrated an understanding of the instructions.   The patient was advised to call back or seek an in-person evaluation if the symptoms worsen or if the condition fails to improve as anticipated.   CMA or LPN served as scribe during this visit. History, Physical, and Plan performed by medical provider. The above documentation has been reviewed and is accurate and complete.   [place], [place] 12/14/2019

## 2019-12-14 NOTE — Telephone Encounter (Signed)
Nurse Assessment Nurse: Shelva Majestic, RN, Marchelle Folks Date/Time Lamount Cohen Time): 12/14/2019 7:18:40 AM Confirm and document reason for call. If symptomatic, describe symptoms. ---Caller states she has a productive cough and her sinuses are draining Has the patient had close contact with a person known or suspected to have the novel coronavirus illness OR traveled / lives in area with major community spread (including international travel) in the last 14 days from the onset of symptoms? * If Asymptomatic, screen for exposure and travel within the last 14 days. ---No Does the patient have any new or worsening symptoms? ---Yes Will a triage be completed? ---Yes Related visit to physician within the last 2 weeks? ---No Does the PT have any chronic conditions? (i.e. diabetes, asthma, this includes High risk factors for pregnancy, etc.) ---Yes List chronic conditions. ---diabetes Is the patient pregnant or possibly pregnant? (Ask all females between the ages of 11-55) ---No Is this a behavioral health or substance abuse call? ---No Guidelines Guideline Title Affirmed Question Affirmed Notes Nurse Date/Time (Eastern Time) Cough - Acute Productive Cough with cold symptoms (e.g., runny [place], RN, Marchelle Folks 12/14/2019 7:20:37 AMPLEASE NOTE: All timestamps contained within this report are represented as [place] Standard Time. CONFIDENTIALTY NOTICE: This fax transmission is intended only for the addressee. It contains information that is legally privileged, confidential or otherwise protected from use or disclosure. If you are not the intended recipient, you are strictly prohibited from reviewing, disclosing, copying using or disseminating any of this information or taking any action in reliance on or regarding this information. If you have received this fax in error, please notify us immediately by telephone so that we can arrange for its return to [place]. Phone: [phone], Toll-Free: [phone], Fax:  [phone] Page: 2 of 2 Call Id: [id number] Guidelines Guideline Title Affirmed Question Affirmed Notes Nurse Date/Time Lamount Cohen Time) nose, postnasal drip, throat clearing) Disp. Time Lamount Cohen Time) Disposition Final User 12/14/2019 7:30:01 AM Home Care Yes [place], RN, Marchelle Folks Caller Disagree/Comply Comply Caller Understands Yes PreDisposition Call Doctor Care Advice Given Per Guideline HOME CARE: REASSURANCE AND EDUCATION - COUGH WITH COMMON COLD SYMPTOMS: * It sounds like an uncomplicated cold that we can treat at home. COUGH MEDICINES: FOR A RUNNY NOSE - BLOW YOUR NOSE: * Nasal mucus and discharge help wash viruses and bacteria out of the nose and sinuses. NASAL WASHES - STEP-BY-STEP INSTRUCTIONS: * STEP 1: Lean over a sink. HOW TO MAKE SALINE (SALT WATER) NASAL WASH: * Put 1 cup (8 oz; 240 ml) of water in a clean container. * Pseudoephedrine (Sudafed): Available over-the-counter in pill form. Typical adult dosage is two 30 mg tablets every 6 hours. * For fevers above 101 F (38.3 C) take either acetaminophen or ibuprofen. FEVER MEDICINES: CONTAGIOUSNESS: * The cold virus is present in your nasal secretions. CALL BACK IF: * Fever lasts over 3 days * Earache or facial pain develops * You become worse. CARE ADVICE given per Cough - Acute Productive (Adult) guideline

## 2019-12-16 DIAGNOSIS — F810 Specific reading disorder: Secondary | ICD-10-CM | POA: Diagnosis not present

## 2019-12-16 DIAGNOSIS — F900 Attention-deficit hyperactivity disorder, predominantly inattentive type: Secondary | ICD-10-CM | POA: Diagnosis not present

## 2019-12-17 ENCOUNTER — Emergency Department (HOSPITAL_COMMUNITY)
Admission: EM | Admit: 2019-12-17 | Discharge: 2019-12-18 | Disposition: A | Discharge status: Home / Self Care | Payer: BC Managed Care – PPO | Attending: Emergency Medicine | Admitting: Emergency Medicine

## 2019-12-17 ENCOUNTER — Emergency Department (HOSPITAL_COMMUNITY): Payer: BC Managed Care – PPO

## 2019-12-17 ENCOUNTER — Other Ambulatory Visit: Payer: Self-pay

## 2019-12-17 DIAGNOSIS — R0789 Other chest pain: Secondary | ICD-10-CM | POA: Diagnosis not present

## 2019-12-17 DIAGNOSIS — Z7984 Long term (current) use of oral hypoglycemic drugs: Secondary | ICD-10-CM | POA: Insufficient documentation

## 2019-12-17 DIAGNOSIS — E119 Type 2 diabetes mellitus without complications: Secondary | ICD-10-CM | POA: Insufficient documentation

## 2019-12-17 DIAGNOSIS — R0602 Shortness of breath: Secondary | ICD-10-CM | POA: Insufficient documentation

## 2019-12-17 DIAGNOSIS — F17210 Nicotine dependence, cigarettes, uncomplicated: Secondary | ICD-10-CM | POA: Diagnosis not present

## 2019-12-17 DIAGNOSIS — R079 Chest pain, unspecified: Secondary | ICD-10-CM

## 2019-12-17 LAB — BASIC METABOLIC PANEL
Anion gap: 14 (ref 5–15)
BUN: 9 mg/dL (ref 6–20)
CO2: 18 mmol/L — ABNORMAL LOW (ref 22–32)
Calcium: 9.7 mg/dL (ref 8.9–10.3)
Chloride: 105 mmol/L (ref 98–111)
Creatinine, Ser: 0.77 mg/dL (ref 0.44–1.00)
GFR calc Af Amer: >60 mL/min (ref >60)
GFR calc non Af Amer: >60 mL/min (ref >60)
Glucose, Bld: 211 mg/dL — ABNORMAL HIGH (ref 70–99)
Potassium: 3.6 mmol/L (ref 3.5–5.1)
Sodium: 137 mmol/L (ref 135–145)

## 2019-12-17 LAB — CBC
HCT: 41.4 % (ref 36.0–46.0)
Hemoglobin: 14.3 g/dL (ref 12.0–15.0)
MCH: 31.2 pg (ref 26.0–34.0)
MCHC: 34.5 g/dL (ref 30.0–36.0)
MCV: 90.4 fL (ref 80.0–100.0)
Platelets: 311 10*3/uL (ref 150–400)
RBC: 4.58 MIL/uL (ref 3.87–5.11)
RDW: 11.7 % (ref 11.5–15.5)
WBC: 10.7 10*3/uL — ABNORMAL HIGH (ref 4.0–10.5)
nRBC: 0 % (ref 0.0–0.2)

## 2019-12-17 LAB — TROPONIN I (HIGH SENSITIVITY): Troponin I (High Sensitivity): <2 ng/L (ref <18)

## 2019-12-17 LAB — I-STAT BETA HCG BLOOD, ED (MC, WL, AP ONLY): I-stat hCG, quantitative: <5 m[IU]/mL (ref <5)

## 2019-12-17 NOTE — ED Triage Notes (Signed)
Pt sts she was feeling completely fine this morning, but while she was sitting at her computer working developed shortness of breath. Took her blood pressure and it was 140/100 with pulse 112. Several other readings between 140 and 160 systolic in the next hour with no hx of hypertension. Sts she just doesn't "feel right" in her chest. Feeling lightheaded and shortness of breath still remains on arrival to ED.

## 2019-12-18 ENCOUNTER — Ambulatory Visit (INDEPENDENT_AMBULATORY_CARE_PROVIDER_SITE_OTHER): Payer: BC Managed Care – PPO | Admitting: Physician Assistant

## 2019-12-18 ENCOUNTER — Encounter: Payer: Self-pay | Admitting: Physician Assistant

## 2019-12-18 ENCOUNTER — Telehealth: Payer: Self-pay | Admitting: Physician Assistant

## 2019-12-18 ENCOUNTER — Other Ambulatory Visit: Payer: Self-pay | Admitting: Physician Assistant

## 2019-12-18 VITALS — BP 156/100 | HR 91 | Temp 98.4°F | Ht 62.0 in | Wt 175.5 lb

## 2019-12-18 DIAGNOSIS — E1165 Type 2 diabetes mellitus with hyperglycemia: Secondary | ICD-10-CM

## 2019-12-18 DIAGNOSIS — R062 Wheezing: Secondary | ICD-10-CM

## 2019-12-18 DIAGNOSIS — R030 Elevated blood-pressure reading, without diagnosis of hypertension: Secondary | ICD-10-CM

## 2019-12-18 DIAGNOSIS — R000 Tachycardia, unspecified: Secondary | ICD-10-CM

## 2019-12-18 DIAGNOSIS — I471 Supraventricular tachycardia: Secondary | ICD-10-CM

## 2019-12-18 LAB — D-DIMER, QUANTITATIVE: D-Dimer, Quant: 0.5 ug/mL-FEU (ref 0.00–0.50)

## 2019-12-18 LAB — TROPONIN I (HIGH SENSITIVITY): Troponin I (High Sensitivity): <2 ng/L (ref <18)

## 2019-12-18 MED ORDER — BUDESONIDE-FORMOTEROL FUMARATE 80-4.5 MCG/ACT IN AERO
2.0000 | INHALATION_SPRAY | Freq: Two times a day (BID) | RESPIRATORY_TRACT | 3 refills | Status: DC
Start: 2019-12-18 — End: 2020-01-15

## 2019-12-18 MED ORDER — LOSARTAN POTASSIUM 25 MG PO TABS: 25.0000 mg | ORAL_TABLET | Freq: Every day | ORAL | 2 refills | Status: AC

## 2019-12-18 NOTE — Patient Instructions (Addendum)
It was great to see you!  Stop the prednsione. Start 25 mg losartan (cozaar) daily. Start symbicort inhaler in the AM and PM.  Consider taking a break from the adderall on the weekends to see if you physically feel different with this -- if you do, please call the [facility] to be evaluated sooner.  Follow-up with me in 1 month, sooner if concerns.  Take care,  Jarold Motto PA-C

## 2019-12-18 NOTE — Telephone Encounter (Signed)
Patient decide to go to [facility]  Chief Complaint Headache Reason for Call Symptomatic / Request for Health Information Initial Comment Caller states is w/Answering Service. Pt is not feeling good. Bp is 145/100. Pulse rate is114 Mild nausea and a headache. Translation No Nurse Assessment Nurse: Fransisco Hertz, RN, Elnita Maxwell Date/Time Lamount Cohen Time): 12/17/2019 2:09:43 PM Confirm and document reason for call. If symptomatic, describe symptoms. ---Caller states she is not feeling well. BP is 145/100 with pulse 114. She has mild nausea and a headache. Has the patient had close contact with a person known or suspected to have the novel coronavirus illness OR traveled / lives in area with major community spread (including international travel) in the last 14 days from the onset of symptoms? * If Asymptomatic, screen for exposure and travel within the last 14 days. ---No Does the patient have any new or worsening symptoms? ---Yes Will a triage be completed? ---Yes Related visit to physician within the last 2 weeks? ---Yes Does the PT have any chronic conditions? (i.e. diabetes, asthma, this includes High risk factors for pregnancy, etc.) ---Yes List chronic conditions. ---diabetes Is the patient pregnant or possibly pregnant? (Ask all females between the ages of 27-55) ---No Is this a behavioral health or substance abuse call? ---No Guidelines Guideline Title Affirmed Question Affirmed Notes Nurse Date/Time (Eastern Time) Blood Pressure - High Systolic BP >= 160 OR Diastolic >= 100 [place], RN, Elnita Maxwell 12/17/2019 2:12:01 PM Disp. Time (Eastern Time) Disposition Final UserPLEASE NOTE: All timestamps contained within this report are represented as [place] Standard Time. CONFIDENTIALTY NOTICE: This fax transmission is intended only for the addressee. It contains information that is legally privileged, confidential or otherwise protected from use or disclosure. If you are not the intended recipient, you  are strictly prohibited from reviewing, disclosing, copying using or disseminating any of this information or taking any action in reliance on or regarding this information. If you have received this fax in error, please notify us immediately by telephone so that we can arrange for its return to [place]. Phone: [phone], Toll-Free: [phone], Fax: [phone] Page: 2 of 2 Call Id: [id number] 12/17/2019 2:22:35 PM SEE PCP WITHIN 3 DAYS Yes Fransisco Hertz, RN, Elizabeth Sauer Disagree/Comply Comply Caller Understands Yes PreDisposition Did not know what to do Care Advice Given Per Guideline SEE PCP WITHIN 3 DAYS: CALL BACK IF: * You become worse. * Chest pain or difficulty breathing occurs * Your blood pressure is over 180/110 * Difficulty walking, difficulty talking, or severe headache occurs Comments User: Caryn Bee, RN Date/Time (Eastern Time): 12/17/2019 2:24:18 PM Repeat BP is 150/101 with pulse 115 and feels dizzy. Caller is stating she is going to the urgent care.

## 2019-12-18 NOTE — Telephone Encounter (Signed)
Please call patient --   After more thinking after our visit, I think it would be best to send her to cardiology, rather than ordering a stress test so she can have more formal and thorough evaluation.  I have put this referral in for her.

## 2019-12-18 NOTE — ED Provider Notes (Signed)
[facility]-EMERGENCY DEPT [facility] Emergency Department Provider Note MRN:  [id number]  Arrival date & time: 12/18/19     Chief Complaint   Shortness of Breath   History of Present Illness   Mary Howard is a 44 y.o. year-old female with a history of hyperlipidemia, diabetes presenting to the ED with chief complaint of shortness of breath.  Sudden onset chest pain described as a pressure/tightness in the center of the chest.  This occurred at 11 AM yesterday.  Patient has been in the waiting room for nearly 12 hours.  She explains that it was hard to take a deep breath during this pain.  She was checking her vitals and noted that her heart rate was elevated up to 112, her blood pressure was also mildly elevated up to 140.  The symptoms recurred 2 more times while she was in the waiting room.  Currently without symptoms.  Denies headache or vision change, no abdominal pain, no numbness or weakness to the arms or legs.  Had a 3 to 4-hour car trip a few days ago.  Denies birth control pills.  Review of Systems  A complete 10 system review of systems was obtained and all systems are negative except as noted in the HPI and PMH.   Patient's Health History    Past Medical History:  Diagnosis Date  . Anxiety   . Depression   . Diabetes mellitus without complication (HCC)   . Hyperlipidemia     Past Surgical History:  Procedure Laterality Date  . ABDOMINAL HYSTERECTOMY    . CHOLECYSTECTOMY      Family History  Problem Relation Age of Onset  . Diabetes Mother   . Bipolar disorder Mother   . Hypertension Mother   . Hyperlipidemia Father   . Heart disease Father   . Stroke Father   . Cancer Paternal Uncle        possibly stomach  . Colon cancer Paternal Grandmother 24  . Cancer Paternal Grandfather        in chest  . Bipolar disorder Sister     Social History   Socioeconomic History  . Marital status: Divorced    Spouse name: Not on file  . Number of children: Not on file    . Years of education: Not on file  . Highest education level: Not on file  Occupational History  . Not on file  Tobacco Use  . Smoking status: Current Some Day Smoker    Packs/day: 0.25    Years: 16.00    Pack years: 4.00  . Smokeless tobacco: Never Used  Vaping Use  . Vaping Use: Never used  Substance and Sexual Activity  . Alcohol use: Yes    Comment: 2-3 glasses of wine a month  . Drug use: Never  . Sexual activity: Yes    Birth control/protection: Surgical, Condom  Other Topics Concern  . Not on file  Social History Narrative   Works from home   Social Determinants of Corporate investment banker Strain:   . Difficulty of Paying Living Expenses:   Food Insecurity:   . Worried About Programme researcher, broadcasting/film/video in the Last Year:   . Barista in the Last Year:   Transportation Needs:   . Freight forwarder (Medical):   Marland Kitchen Lack of Transportation (Non-Medical):   Physical Activity:   . Days of Exercise per Week:   . Minutes of Exercise per Session:   Stress:   .  Feeling of Stress :   Social Connections:   . Frequency of Communication with Friends and Family:   . Frequency of Social Gatherings with Friends and Family:   . Attends Religious Services:   . Active Member of Clubs or Organizations:   . Attends Banker Meetings:   Marland Kitchen Marital Status:   Intimate Partner Violence:   . Fear of Current or Ex-Partner:   . Emotionally Abused:   Marland Kitchen Physically Abused:   . Sexually Abused:      Physical Exam   Vitals:   12/17/19 2301 12/18/19 0320  BP: (!) 143/96 135/90  Pulse: 100 87  Resp: 18 15  Temp:    SpO2: 99% 98%    CONSTITUTIONAL: Well-appearing, NAD NEURO:  Alert and oriented x 3, no focal deficits EYES:  eyes equal and reactive ENT/NECK:  no LAD, no JVD CARDIO: Regular rate, well-perfused, normal S1 and S2 PULM:  CTAB no wheezing or rhonchi GI/GU:  normal bowel sounds, non-distended, non-tender MSK/SPINE:  No gross deformities, no  edema SKIN:  no rash, atraumatic PSYCH:  Appropriate speech and behavior  *Additional and/or pertinent findings included in MDM below  Diagnostic and Interventional Summary    EKG Interpretation  Date/Time:  Thursday December 17 2019 15:06:14 EDT Ventricular Rate:  107 PR Interval:  126 QRS Duration: 70 QT Interval:  370 QTC Calculation: 493 R Axis:   -14 Text Interpretation: Sinus tachycardia Otherwise normal ECG No previous ECGs available Confirmed by Kennis Carina [phone]) on 12/18/2019 3:17:14 AM      Labs Reviewed  BASIC METABOLIC PANEL - Abnormal; Notable for the following components:      Result Value   CO2 18 (*)    Glucose, Bld 211 (*)    All other components within normal limits  CBC - Abnormal; Notable for the following components:   WBC 10.7 (*)    All other components within normal limits  D-DIMER, QUANTITATIVE (NOT AT [facility])  I-STAT BETA HCG BLOOD, ED ([facility], [facility], [facility] ONLY)  TROPONIN I (HIGH SENSITIVITY)  TROPONIN I (HIGH SENSITIVITY)    DG Chest 2 View  Final Result      Medications - No data to display   Procedures  /  Critical Care Procedures  ED Course and Medical Decision Making  I have reviewed the triage vital signs, the nursing notes, and pertinent available records from the EMR.  Listed above are laboratory and imaging tests that I personally ordered, reviewed, and interpreted and then considered in my medical decision making (see below for details).      EKG is without ischemic changes, troponin is negative x2, patient does have a few risk factors for CAD but I doubt acute coronary syndrome at this time.  Given the description, PE is also considered but she is felt to be low risk.  Will screen with D-dimer.  D-dimer is negative, patient is appropriate for discharge with PCP follow-up.  Elmer Sow. Pilar Plate, MD [facility] Emergency Medicine [facility] [email or web address]  Final Clinical Impressions(s) / ED Diagnoses     ICD-10-CM    1. Chest pain, unspecified type  R07.9     ED Discharge Orders    None       Discharge Instructions Discussed with and Provided to Patient:     Discharge Instructions     You were evaluated in the Emergency Department and after careful evaluation, we did not find any emergent condition requiring admission or further testing in the  hospital.  Your exam/testing today is overall reassuring.  Blood testing did not show any signs of heart damage or blood clots.  Please return to the Emergency Department if you experience any worsening of your condition.  We encourage you to follow up with a primary care provider.  Thank you for allowing [place] to be a part of your care.       Maudie Flakes, MD 12/18/19 [phone]

## 2019-12-18 NOTE — Telephone Encounter (Signed)
Spoke to pt told her Mary Howard said, after more thinking after our visit, I think it would be best to send her to cardiology, rather than ordering a stress test so she can have more formal and thorough evaluation. She has placed the referral to Cardiology and someone will contact you to schedule an appointment. Pt verbalized understanding.

## 2019-12-18 NOTE — Discharge Instructions (Addendum)
You were evaluated in the Emergency Department and after careful evaluation, we did not find any emergent condition requiring admission or further testing in the hospital.  Your exam/testing today is overall reassuring.  Blood testing did not show any signs of heart damage or blood clots.  Please return to the Emergency Department if you experience any worsening of your condition.  We encourage you to follow up with a primary care provider.  Thank you for allowing [place] to be a part of your care.

## 2019-12-18 NOTE — Progress Notes (Addendum)
Mary Howard is a 44 y.o. female is here for follow up from [facility].  I acted as a Education administrator for [employer], PA-C Mary Pickler, Mary Howard   History of Present Illness:   Chief Complaint  Patient presents with  . Follow-up    HPI   ED follow up Pt here following up from ED visit last evening, discharged at 5:30 AM.  Yesterday she was working and she had some fluttering in chest. She got a "fuzzy" feeling in her head and the readjusted herself. Felt like her HR was increased. She took a shower and felt fine. After she got dressed she got dizzy. Had another episode of increased HR and felt like her blood pressure was increasing so she went to the ER.  She had negative D-dimer, CBC, CMP, troponin.   BP Readings from Last 3 Encounters:  12/18/19 (!) 156/100  12/18/19 130/90  11/02/19 138/87   She is taking oral prednisone that was given to her on 12/14/19 for a upper respiratory infection. She is having ongoing wheezing.  She has known hx of DM. She is also on Adderall 30 mg daily - managed by [facility].  Health Maintenance Due  Topic Date Due  . Hepatitis C Screening  Never done  . PNEUMOCOCCAL POLYSACCHARIDE VACCINE AGE 50-64 HIGH RISK  Never done  . FOOT EXAM  Never done  . OPHTHALMOLOGY EXAM  Never done  . COVID-19 Vaccine (1) Never done    Past Medical History:  Diagnosis Date  . Anxiety   . Depression   . Diabetes mellitus without complication ([place])   . Hyperlipidemia      Social History   Tobacco Use  . Smoking status: Current Some Day Smoker    Packs/day: 0.25    Years: 16.00    Pack years: 4.00  . Smokeless tobacco: Never Used  Vaping Use  . Vaping Use: Never used  Substance Use Topics  . Alcohol use: Yes    Comment: 2-3 glasses of wine a month  . Drug use: Never    Past Surgical History:  Procedure Laterality Date  . ABDOMINAL HYSTERECTOMY    . CHOLECYSTECTOMY      Family History  Problem Relation Age of Onset  . Diabetes  Mother   . Bipolar disorder Mother   . Hypertension Mother   . Hyperlipidemia Father   . Heart disease Father   . Stroke Father   . Cancer Paternal Uncle        possibly stomach  . Colon cancer Paternal Grandmother 71  . Cancer Paternal Grandfather        in chest  . Bipolar disorder Sister     PMHx, SurgHx, SocialHx, FamHx, Medications, and Allergies were reviewed in the Visit Navigator and updated as appropriate.   Patient Active Problem List   Diagnosis Date Noted  . Hx of diabetes mellitus 04/11/2018  . BMI 33.0-33.9,adult 04/11/2018  . Genital warts 01/15/2018  . Type 2 diabetes mellitus with hyperglycemia, without long-term current use of insulin ([place]) 11/06/2016  . History of kidney stones 11/06/2016  . Moderate episode of recurrent major depressive disorder ([place]) 11/06/2016    Social History   Tobacco Use  . Smoking status: Current Some Day Smoker    Packs/day: 0.25    Years: 16.00    Pack years: 4.00  . Smokeless tobacco: Never Used  Vaping Use  . Vaping Use: Never used  Substance Use Topics  . Alcohol use: Yes  Comment: 2-3 glasses of wine a month  . Drug use: Never    Current Medications and Allergies:    Current Outpatient Medications:  .  albuterol (VENTOLIN HFA) 108 (90 Base) MCG/ACT inhaler, Inhale into the lungs., Disp: , Rfl:  .  amphetamine-dextroamphetamine (ADDERALL) 30 MG tablet, Take 30 mg by mouth daily. 1 capsule every day, Disp: , Rfl:  .  atorvastatin (LIPITOR) 20 MG tablet, Take 1 tablet (20 mg total) by mouth daily., Disp: 90 tablet, Rfl: 3 .  metFORMIN (GLUCOPHAGE) 500 MG tablet, Take 1 tablet (500 mg total) by mouth 2 (two) times daily with a meal., Disp: 180 tablet, Rfl: 3 .  predniSONE (DELTASONE) 20 MG tablet, Take 20 mg daily, Disp: 5 tablet, Rfl: 0 .  traZODone (DESYREL) 50 MG tablet, Take 0.5-1 tablets (25-50 mg total) by mouth at bedtime as needed for sleep., Disp: 30 tablet, Rfl: 3 .  amoxicillin-clavulanate (AUGMENTIN)  875-125 MG tablet, Take 1 tablet by mouth 2 (two) times daily. (Patient not taking: Reported on 12/18/2019), Disp: 20 tablet, Rfl: 0 .  budesonide-formoterol (SYMBICORT) 80-4.5 MCG/ACT inhaler, Inhale 2 puffs into the lungs 2 (two) times daily., Disp: 1 Inhaler, Rfl: 3 .  losartan (COZAAR) 25 MG tablet, Take 1 tablet (25 mg total) by mouth daily., Disp: 30 tablet, Rfl: 2  No Known Allergies  Review of Systems   ROS Negative unless otherwise specified per HPI.  Vitals:   Vitals:   12/18/19 1006  BP: (!) 156/100  Pulse: 91  Temp: 98.4 F (36.9 C)  TempSrc: Temporal  SpO2: 95%  Weight: 175 lb 8 oz (79.6 kg)  Height: 5\' 2"  (1.575 m)     Body mass index is 32.1 kg/m.   Physical Exam:    Physical Exam Vitals and nursing note reviewed.  Constitutional:      General: She is not in acute distress.    Appearance: She is well-developed. She is not ill-appearing or toxic-appearing.  Cardiovascular:     Rate and Rhythm: Normal rate and regular rhythm.     Pulses: Normal pulses.     Heart sounds: Normal heart sounds, S1 normal and S2 normal.     Comments: No LE edema Pulmonary:     Effort: Pulmonary effort is normal.     Breath sounds: Examination of the right-upper field reveals wheezing. Examination of the left-upper field reveals wheezing. Examination of the right-lower field reveals wheezing. Examination of the left-lower field reveals wheezing. Wheezing present.  Skin:    General: Skin is warm and dry.  Neurological:     Mental Status: She is alert.     GCS: GCS eye subscore is 4. GCS verbal subscore is 5. GCS motor subscore is 6.  Psychiatric:        Speech: Speech normal.        Behavior: Behavior normal. Behavior is cooperative.      Assessment and Plan:    [name] was seen today for follow-up.  Diagnoses and all orders for this visit:  Elevated blood pressure reading; Type 2 diabetes mellitus with hyperglycemia, without long-term current use of insulin (HCC) She  is no distress today. Work-up negative in ER. I feel as though her symptoms may have been related to the oral prednisone. I have asked her to stop this. I have also asked her to take a break from Adderall over the weekend to see if this helps her symptoms and if it does to reach out to the prescriber.  Will also  start 25 mg cozaar for renal protection given her hx of diabetes. BP log given. Follow-up in 1 month.  She is also interested in stress test -- will refer to cardiology.  Wheezing Uncontrolled. CXR in ER this morning normal. Will stop oral prednisone due to possible side effects. Start symbicort BID.  Other orders -     losartan (COZAAR) 25 MG tablet; Take 1 tablet (25 mg total) by mouth daily. -     budesonide-formoterol (SYMBICORT) 80-4.5 MCG/ACT inhaler; Inhale 2 puffs into the lungs 2 (two) times daily.    . Reviewed expectations re: course of current medical issues. . Discussed self-management of symptoms. . Outlined signs and symptoms indicating need for more acute intervention. . Patient verbalized understanding and all questions were answered. . See orders for this visit as documented in the electronic medical record. . Patient received an After Visit Summary.  CMA or Mary Howard served as scribe during this visit. History, Physical, and Plan performed by medical provider. The above documentation has been reviewed and is accurate and complete.   Jarold Motto, PA-C [place] [place] 12/18/2019  Follow-up: No follow-ups on file.

## 2019-12-18 NOTE — Telephone Encounter (Signed)
FYI, pt went to [facility]

## 2019-12-21 ENCOUNTER — Telehealth: Payer: Self-pay | Admitting: Physician Assistant

## 2019-12-21 MED ORDER — ACCU-CHEK SOFTCLIX LANCETS MISC: 4 refills | Status: AC

## 2019-12-21 MED ORDER — ACCU-CHEK GUIDE VI STRP: ORAL_STRIP | 4 refills | Status: AC

## 2019-12-21 MED ORDER — ACCU-CHEK GUIDE ME W/DEVICE KIT: PACK | 0 refills | Status: AC

## 2019-12-21 NOTE — Addendum Note (Signed)
Addended by: Jimmye Norman on: 12/21/2019 04:30 PM   Modules accepted: Orders

## 2019-12-21 NOTE — Telephone Encounter (Signed)
Spoke to pt told her Rx Elwin Sleight is covered by insurance it will be $25.00 they had to order it and it came in today and they are getting it ready for you. Also the Albuterol inhaler is covered also but Lelon Mast wants you to use the [facility]. Pt verbalized understanding and asked if the glucometer and supplies were sent to pharmacy. Tol dpt she will need to contact insurance to see which is covered. Pt said she did Accu-Chek Guide Me. Told pt okay I will send to pharmacy for you. Pt verbalized understandng. Rx's sent.

## 2019-12-21 NOTE — Telephone Encounter (Signed)
Called pharmacy and spoke to Summer. Asked her if Albuterol inhaler is covered or not? Summer said she has no recent Rx for that but was covered back in Jan. Told her okay, is the [facility] covered that we sent in. She said yes, we had to order it and it came in today will be $25.00. Told her okay I will let pt know. Summer verbalized understanding.

## 2019-12-21 NOTE — Telephone Encounter (Signed)
Pt called stating her insurance does not cover the VENTOLIN HFA 108 mcg inhaler. Pt is asking if Lelon Mast will prescribe something different that may be covered by insurance. Pt also asked if she could be prescribed a glucometer, lancets, and test strips to check her blood sugar regularly as discussed in her visit with [facility]. Please advise.

## 2019-12-21 NOTE — Telephone Encounter (Signed)
Please call pharmacy and see what inhaler is covered? Are we sure its the albuterol inhaler that is not covered? Please make sure that is not the other new inhaler that we prescribed.  Okay to send in glucometer, lancets, and test strips.

## 2019-12-21 NOTE — Telephone Encounter (Signed)
Please advise 

## 2019-12-25 ENCOUNTER — Other Ambulatory Visit: Payer: Self-pay | Admitting: Family Medicine

## 2019-12-25 NOTE — Telephone Encounter (Signed)
Request for RF- sent for review  

## 2019-12-28 ENCOUNTER — Encounter: Payer: Self-pay | Admitting: Physician Assistant

## 2019-12-29 ENCOUNTER — Other Ambulatory Visit (INDEPENDENT_AMBULATORY_CARE_PROVIDER_SITE_OTHER): Payer: BC Managed Care – PPO

## 2019-12-29 ENCOUNTER — Other Ambulatory Visit: Payer: Self-pay | Admitting: *Deleted

## 2019-12-29 ENCOUNTER — Telehealth (INDEPENDENT_AMBULATORY_CARE_PROVIDER_SITE_OTHER): Payer: BC Managed Care – PPO | Admitting: Physician Assistant

## 2019-12-29 ENCOUNTER — Encounter: Payer: Self-pay | Admitting: Physician Assistant

## 2019-12-29 ENCOUNTER — Other Ambulatory Visit: Payer: Self-pay

## 2019-12-29 VITALS — Ht 62.0 in | Wt 174.0 lb

## 2019-12-29 DIAGNOSIS — R300 Dysuria: Secondary | ICD-10-CM

## 2019-12-29 DIAGNOSIS — N390 Urinary tract infection, site not specified: Secondary | ICD-10-CM

## 2019-12-29 DIAGNOSIS — N898 Other specified noninflammatory disorders of vagina: Secondary | ICD-10-CM

## 2019-12-29 LAB — POCT URINALYSIS DIPSTICK OB
Blood, UA: NEGATIVE
Glucose, UA: NEGATIVE
Ketones, UA: POSITIVE
Leukocytes, UA: NEGATIVE
Nitrite, UA: NEGATIVE
Spec Grav, UA: >=1.03 — AB (ref 1.010–1.025)
Urobilinogen, UA: 0.2 E.U./dL
pH, UA: 5.5 (ref 5.0–8.0)

## 2019-12-29 MED ORDER — ATORVASTATIN CALCIUM 20 MG PO TABS: 20.0000 mg | ORAL_TABLET | Freq: Every day | ORAL | 3 refills | Status: AC

## 2019-12-29 MED ORDER — FLUCONAZOLE 150 MG PO TABS
150.0000 mg | ORAL_TABLET | ORAL | 0 refills | Status: DC
Start: 2019-12-29 — End: 2020-01-15

## 2019-12-29 NOTE — Progress Notes (Signed)
Cardiology Office Note:   Date:  12/30/2019  NAME:  Mary Howard    MRN: [id number] DOB:  1976-05-31   PCP:  Inda Coke, PA  Cardiologist:  No primary care provider on file.  Electrophysiologist:  None   Referring MD: Inda Coke, PA   Chief Complaint  Patient presents with  . Palpitations   History of Present Illness:   Mary Howard is a 44 y.o. female with a hx of HTN, HLD, DM who is being seen today for the evaluation of palpitations at the request of Inda Coke, [place]. Evaluated in the ER 6/18 for SOB. Troponin negative x 2.    She reports she was evaluated emergency room on 618 for rapid heartbeat and shortness of breath.  She also had intermittent chest tightness.  Apparently she was started on prednisone on 6/14.  On 6/17 she was at work in began to have shortness of breath and rapid heartbeat sensation.  She felt like her heart was going to beat out of her chest.  This was associated with pounding in her chest.  She reports he was sitting at her desk when the symptoms happen.  She took a shower but symptoms did not resolve.  She apparently went to the emergency room where she ruled out for an acute coronary syndrome.  Her symptoms resolved after about 12 hours.  She reports has not had any further symptoms since that time.  She is stopped taking prednisone.  She denies any chest pain or shortness of breath with her current level of activity.  She does not exercise routinely.  She is a current smoker and smoked for about 20 years.  She does have diabetes with an A1c of 7.4.  She had diabetes for 7 years.  She has normal kidney function.  She reports that her primary care physician encouraged her to follow-up today to determine if a stress test was necessary.  She does have a history of heart disease in the family.  Her sister has long QT syndrome and her father has heart valve replacement.  Her EKG today shows normal sinus rhythm with no evidence of ischemia or prior  infarction.  She has normal intervals.  She drinks plenty water.  No excess alcohol consumption.  No illicit drug use reported.  Problem List 1. Diabetes -A1c 7.4 2. HLD -T chol 190, HDL 53, LDL 106, TG 151 3. HTN  Past Medical History: Past Medical History:  Diagnosis Date  . Anxiety   . Depression   . Diabetes mellitus without complication ([place])   . Hyperlipidemia     Past Surgical History: Past Surgical History:  Procedure Laterality Date  . ABDOMINAL HYSTERECTOMY    . CHOLECYSTECTOMY      Current Medications: Current Meds  Medication Sig  . Accu-Chek Softclix Lancets lancets USE TO CHECK BLOOD SUGAR DAILY AND PRN  . amphetamine-dextroamphetamine (ADDERALL) 30 MG tablet Take 30 mg by mouth daily. 1 capsule every day  . atorvastatin (LIPITOR) 20 MG tablet Take 1 tablet (20 mg total) by mouth daily.  . Blood Glucose Monitoring Suppl (ACCU-CHEK GUIDE ME) w/Device KIT USE TO CHECK BLOOD SUGARS DAILY AND PRN  . fluconazole (DIFLUCAN) 150 MG tablet Take 1 tablet (150 mg total) by mouth 2 (two) times a week.  Marland Kitchen glucose blood (ACCU-CHEK GUIDE) test strip USE TO CHECK BLOOD SUGAR DAILY AND PRN  . losartan (COZAAR) 25 MG tablet Take 1 tablet (25 mg total) by mouth daily.  . metFORMIN (GLUCOPHAGE) 500  MG tablet Take 1 tablet (500 mg total) by mouth 2 (two) times daily with a meal.  . mometasone-formoterol (DULERA) 100-5 MCG/ACT AERO Inhale 2 puffs into the lungs in the morning and at bedtime.  . traZODone (DESYREL) 50 MG tablet Take 0.5-1 tablets (25-50 mg total) by mouth at bedtime as needed for sleep.     Allergies:    Patient has no known allergies.   Social History: Social History   Socioeconomic History  . Marital status: Single    Spouse name: Not on file  . Number of children: 2  . Years of education: Not on file  . Highest education level: Not on file  Occupational History  . Not on file  Tobacco Use  . Smoking status: Current Some Day Smoker    Packs/day: 0.25     Years: 20.00    Pack years: 5.00  . Smokeless tobacco: Never Used  Vaping Use  . Vaping Use: Never used  Substance and Sexual Activity  . Alcohol use: Yes    Comment: 2-3 glasses of wine a month  . Drug use: Never  . Sexual activity: Yes    Birth control/protection: Surgical, Condom  Other Topics Concern  . Not on file  Social History Narrative   Works from home   Social Determinants of Radio broadcast assistant Strain:   . Difficulty of Paying Living Expenses:   Food Insecurity:   . Worried About Charity fundraiser in the Last Year:   . Arboriculturist in the Last Year:   Transportation Needs:   . Film/video editor (Medical):   Marland Kitchen Lack of Transportation (Non-Medical):   Physical Activity:   . Days of Exercise per Week:   . Minutes of Exercise per Session:   Stress:   . Feeling of Stress :   Social Connections:   . Frequency of Communication with Friends and Family:   . Frequency of Social Gatherings with Friends and Family:   . Attends Religious Services:   . Active Member of Clubs or Organizations:   . Attends Archivist Meetings:   Marland Kitchen Marital Status:      Family History: The patient's family history includes Bipolar disorder in her mother and sister; Cancer in her paternal grandfather and paternal uncle; Colon cancer (age of onset: 42) in her paternal grandmother; Diabetes in her mother; Heart disease in her father and sister; Hyperlipidemia in her father; Hypertension in her mother; Stroke in her father.  ROS:   All other ROS reviewed and negative. Pertinent positives noted in the HPI.     EKGs/Labs/Other Studies Reviewed:   The following studies were personally reviewed by me today:  EKG:  EKG is ordered today.  The ekg ordered today demonstrates normal sinus rhythm, heart rate 93, no acute ST-T changes, no evidence of prior infarction, and was personally reviewed by me.   Recent Labs: 08/04/2019: ALT 12 12/17/2019: BUN 9; Creatinine, Ser  0.77; Hemoglobin 14.3; Platelets 311; Potassium 3.6; Sodium 137   Recent Lipid Panel    Component Value Date/Time   CHOL 190 08/04/2019 1024   CHOL 105 03/26/2019 0923   TRIG 151.0 (H) 08/04/2019 1024   HDL 53.40 08/04/2019 1024   HDL 40 03/26/2019 0923   CHOLHDL 4 08/04/2019 1024   VLDL 30.2 08/04/2019 1024   LDLCALC 106 (H) 08/04/2019 1024   LDLCALC 42 03/26/2019 0923    Physical Exam:   VS:  BP 130/80   Pulse  93   Temp 98.1 F (36.7 C)   Ht _0  (1.575 m)   Wt 174 lb 3.2 oz (79 kg)   SpO2 98%   BMI 31.86 kg/m    Wt Readings from Last 3 Encounters:  12/30/19 174 lb 3.2 oz (79 kg)  12/29/19 174 lb (78.9 kg)  12/18/19 175 lb 8 oz (79.6 kg)    General: Well nourished, well developed, in no acute distress Heart: Atraumatic, normal size  Eyes: PEERLA, EOMI  Neck: Supple, no JVD Endocrine: No thryomegaly Cardiac: Normal S1, S2; RRR; no murmurs, rubs, or gallops Lungs: Clear to auscultation bilaterally, no wheezing, rhonchi or rales  Abd: Soft, nontender, no hepatomegaly  Ext: No edema, pulses 2+ Musculoskeletal: No deformities, BUE and BLE strength normal and equal Skin: Warm and dry, no rashes   Neuro: Alert and oriented to person, place, time, and situation, CNII-XII grossly intact, no focal deficits  Psych: Normal mood and affect   ASSESSMENT:   Mary Howard is a 44 y.o. female who presents for the following: 1. Chest pain of uncertain etiology   2. SOB (shortness of breath)   3. Palpitations   4. Essential hypertension   5. Mixed hyperlipidemia   6. Tobacco abuse     PLAN:   1. Chest pain of uncertain etiology 2. SOB (shortness of breath) 3. Palpitations -Symptoms are likely related to prednisone use.  She said no further episodes.  Her blood pressures well controlled.  CVD risk factors include diabetes, hypertension, tobacco abuse.  Her EKG today demonstrates normal sinus rhythm with no acute ST-T changes or evidence of prior infarction.  She is had no  further recurrence of symptoms.  I think to further reassure her that her heart is okay a stress echocardiogram would be ideal.  This will avoid radiation.  We will also get idea what her functional capacity is.  We will plan to do this and I will follow-up the results with her by phone.  4. Essential hypertension -Continue current medications.  5. Mixed hyperlipidemia -We will continue current statin therapy.  Would encourage her PCP to pursue a LDL goal less than 70 given her diabetes.  6. Tobacco abuse -Counseled on smoking cessation   Disposition: Return if symptoms worsen or fail to improve.  Medication Adjustments/Labs and Tests Ordered: Current medicines are reviewed at length with the patient today.  Concerns regarding medicines are outlined above.  Orders Placed This Encounter  Procedures  . EKG 12-Lead  . ECHOCARDIOGRAM STRESS TEST   No orders of the defined types were placed in this encounter.   Patient Instructions  Medication Instructions:  The current medical regimen is effective;  continue present plan and medications.  *If you need a refill on your cardiac medications before your next appointment, please call your pharmacy*   Testing/Procedures: ECHO STRESS TEST   Follow-Up: At [facility], you and your health needs are our priority.  As part of our continuing mission to provide you with exceptional heart care, we have created designated Provider Care Teams.  These Care Teams include your primary Cardiologist (physician) and Advanced Practice Providers (APPs -  Physician Assistants and Nurse Practitioners) who all work together to provide you with the care you need, when you need it.  We recommend signing up for the patient portal called "MyChart".  Sign up information is provided on this After Visit Summary.  MyChart is used to connect with patients for Virtual Visits (Telemedicine).  Patients are able to view  lab/test results, encounter notes, upcoming  appointments, etc.  Non-urgent messages can be sent to your provider as well.   To learn more about what you can do with MyChart, go to [email or web address].    Your next appointment:   As needed  The format for your next appointment:   In Person  Provider:   Eleonore Chiquito, MD        Signed, Addison Naegeli. Audie Box, [place]  [address], [place], [place] [address] [phone]  12/30/2019 4:49 PM

## 2019-12-29 NOTE — Progress Notes (Signed)
Virtual Visit via Video   I connected with Mary Howard on 12/29/19 at  2:00 PM EDT by a video enabled telemedicine application and verified that I am speaking with the correct person using two identifiers. Location patient: Home Location provider: [facility], Office Persons participating in the virtual visit: Darlys, Buis PA-C, Anselmo Pickler, LPN  I discussed the limitations of evaluation and management by telemedicine and the availability of in person appointments. The patient expressed understanding and agreed to proceed.  I acted as a Education administrator for [employer], [employer], LPN   Subjective:   HPI:   Vaginal irritation Pt c/o vaginal irritation internally started yesterday. Pt having white vaginal discharge and itching. Pt has not tried anything OTC for her symptoms.  She is diabetic. Denies concerns for STDs, dysuria, hematuria.  ROS: See pertinent positives and negatives per HPI.  Patient Active Problem List   Diagnosis Date Noted  . Hx of diabetes mellitus 04/11/2018  . BMI 33.0-33.9,adult 04/11/2018  . Genital warts 01/15/2018  . Type 2 diabetes mellitus with hyperglycemia, without long-term current use of insulin ([place]) 11/06/2016  . History of kidney stones 11/06/2016  . Moderate episode of recurrent major depressive disorder ([place]) 11/06/2016    Social History   Tobacco Use  . Smoking status: Current Some Day Smoker    Packs/day: 0.25    Years: 16.00    Pack years: 4.00  . Smokeless tobacco: Never Used  Substance Use Topics  . Alcohol use: Yes    Comment: 2-3 glasses of wine a month    Current Outpatient Medications:  .  Accu-Chek Softclix Lancets lancets, USE TO CHECK BLOOD SUGAR DAILY AND PRN, Disp: 100 each, Rfl: 4 .  amphetamine-dextroamphetamine (ADDERALL) 30 MG tablet, Take 30 mg by mouth daily. 1 capsule every day, Disp: , Rfl:  .  atorvastatin (LIPITOR) 20 MG tablet, Take 1 tablet (20 mg total) by mouth daily., Disp:  90 tablet, Rfl: 3 .  Blood Glucose Monitoring Suppl (ACCU-CHEK GUIDE ME) w/Device KIT, USE TO CHECK BLOOD SUGARS DAILY AND PRN, Disp: 1 kit, Rfl: 0 .  glucose blood (ACCU-CHEK GUIDE) test strip, USE TO CHECK BLOOD SUGAR DAILY AND PRN, Disp: 100 each, Rfl: 4 .  losartan (COZAAR) 25 MG tablet, Take 1 tablet (25 mg total) by mouth daily., Disp: 30 tablet, Rfl: 2 .  metFORMIN (GLUCOPHAGE) 500 MG tablet, Take 1 tablet (500 mg total) by mouth 2 (two) times daily with a meal., Disp: 180 tablet, Rfl: 3 .  mometasone-formoterol (DULERA) 100-5 MCG/ACT AERO, Inhale 2 puffs into the lungs in the morning and at bedtime., Disp: 13 g, Rfl: 0 .  traZODone (DESYREL) 50 MG tablet, Take 0.5-1 tablets (25-50 mg total) by mouth at bedtime as needed for sleep., Disp: 30 tablet, Rfl: 3 .  fluconazole (DIFLUCAN) 150 MG tablet, Take 1 tablet (150 mg total) by mouth 2 (two) times a week., Disp: 2 tablet, Rfl: 0  No Known Allergies  Objective:   VITALS: Per patient if applicable, see vitals. GENERAL: Alert, appears well and in no acute distress. HEENT: Atraumatic, conjunctiva clear, no obvious abnormalities on inspection of external nose and ears. NECK: Normal movements of the head and neck. CARDIOPULMONARY: No increased WOB. Speaking in clear sentences. I:E ratio WNL.  MS: Moves all visible extremities without noticeable abnormality. PSYCH: Pleasant and cooperative, well-groomed. Speech normal rate and rhythm. Affect is appropriate. Insight and judgement are appropriate. Attention is focused, linear, and appropriate.  NEURO: CN  grossly intact. Oriented as arrived to appointment on time with no prompting. Moves both UE equally.  SKIN: No obvious lesions, wounds, erythema, or cyanosis noted on face or hands.  Results for orders placed or performed in visit on 12/29/19  POC Urinalysis Dipstick OB  Result Value Ref Range   Color, UA clear    Clarity, UA orange    Glucose, UA Negative Negative   Bilirubin, UA 1+     Ketones, UA positive    Spec Grav, UA >=1.030 (A) 1.010 - 1.025   Blood, UA negative    pH, UA 5.5 5.0 - 8.0   POC,PROTEIN,UA Trace Negative, Trace, Small (1+), Moderate (2+), Large (3+), 4+   Urobilinogen, UA 0.2 0.2 or 1.0 E.U./dL   Nitrite, UA negative    Leukocytes, UA Negative Negative   Appearance     Odor      Assessment and Plan:   Corry was seen today for vaginal irritation.  Diagnoses and all orders for this visit:  Vaginal itching  Other orders -     atorvastatin (LIPITOR) 20 MG tablet; Take 1 tablet (20 mg total) by mouth daily. -     fluconazole (DIFLUCAN) 150 MG tablet; Take 1 tablet (150 mg total) by mouth 2 (two) times a week.   UA is concerning for possible dehydration. Urine and symptoms not consistent with acute cystitis. Will treat empirically for yeast infection with oral diflucan. Will need follow-up in office if symptoms persist despite treatment.  . Reviewed expectations re: course of current medical issues. . Discussed self-management of symptoms. . Outlined signs and symptoms indicating need for more acute intervention. . Patient verbalized understanding and all questions were answered. Marland Kitchen Health Maintenance issues including appropriate healthy diet, exercise, and smoking avoidance were discussed with patient. . See orders for this visit as documented in the electronic medical record.  I discussed the assessment and treatment plan with the patient. The patient was provided an opportunity to ask questions and all were answered. The patient agreed with the plan and demonstrated an understanding of the instructions.   The patient was advised to call back or seek an in-person evaluation if the symptoms worsen or if the condition fails to improve as anticipated.   CMA or LPN served as scribe during this visit. History, Physical, and Plan performed by medical provider. The above documentation has been reviewed and is accurate and complete.  [place],  [place] 12/29/2019

## 2019-12-30 ENCOUNTER — Ambulatory Visit (INDEPENDENT_AMBULATORY_CARE_PROVIDER_SITE_OTHER): Payer: BC Managed Care – PPO | Admitting: Cardiovascular Disease

## 2019-12-30 ENCOUNTER — Other Ambulatory Visit: Payer: Self-pay | Admitting: Family Medicine

## 2019-12-30 ENCOUNTER — Encounter: Payer: Self-pay | Admitting: Cardiovascular Disease

## 2019-12-30 VITALS — BP 130/80 | HR 93 | Temp 98.1°F | Ht 62.0 in | Wt 174.2 lb

## 2019-12-30 DIAGNOSIS — E782 Mixed hyperlipidemia: Secondary | ICD-10-CM

## 2019-12-30 DIAGNOSIS — R0602 Shortness of breath: Secondary | ICD-10-CM | POA: Diagnosis not present

## 2019-12-30 DIAGNOSIS — I10 Essential (primary) hypertension: Secondary | ICD-10-CM | POA: Diagnosis not present

## 2019-12-30 DIAGNOSIS — R079 Chest pain, unspecified: Secondary | ICD-10-CM

## 2019-12-30 DIAGNOSIS — R002 Palpitations: Secondary | ICD-10-CM

## 2019-12-30 DIAGNOSIS — Z720 Tobacco use: Secondary | ICD-10-CM

## 2019-12-30 LAB — URINE CULTURE
MICRO NUMBER:: 10647376
SPECIMEN QUALITY:: ADEQUATE

## 2019-12-30 NOTE — Patient Instructions (Signed)
Medication Instructions:  The current medical regimen is effective;  continue present plan and medications.  *If you need a refill on your cardiac medications before your next appointment, please call your pharmacy*   Testing/Procedures: ECHO STRESS TEST   Follow-Up: At [facility], you and your health needs are our priority.  As part of our continuing mission to provide you with exceptional heart care, we have created designated Provider Care Teams.  These Care Teams include your primary Cardiologist (physician) and Advanced Practice Providers (APPs -  Physician Assistants and Nurse Practitioners) who all work together to provide you with the care you need, when you need it.  We recommend signing up for the patient portal called "MyChart".  Sign up information is provided on this After Visit Summary.  MyChart is used to connect with patients for Virtual Visits (Telemedicine).  Patients are able to view lab/test results, encounter notes, upcoming appointments, etc.  Non-urgent messages can be sent to your provider as well.   To learn more about what you can do with MyChart, go to [email or web address].    Your next appointment:   As needed  The format for your next appointment:   In Person  Provider:   Lennie Odor, MD

## 2019-12-31 DIAGNOSIS — F810 Specific reading disorder: Secondary | ICD-10-CM | POA: Diagnosis not present

## 2019-12-31 DIAGNOSIS — F900 Attention-deficit hyperactivity disorder, predominantly inattentive type: Secondary | ICD-10-CM | POA: Diagnosis not present

## 2019-12-31 NOTE — Addendum Note (Signed)
Addended by: Huston Foley on: 12/31/2019 06:18 PM   Modules accepted: Orders

## 2019-12-31 NOTE — Progress Notes (Signed)
Please apologize on my behalf for the delay in reporting back to her; we have had technical difficulties, since upgrading to the new software for the home sleep test equipment (technology! She will appreciate when I'm talking about).  Patient referred by Jarold Motto, seen by me on 11/02/19 with a prior Dx of OSA (no longer on CPAP), HST on 12/07/19.    Please call and notify the patient that the recent home sleep test showed obstructive sleep apnea. OSA is overall mild, but worth treating to see if she feels better after treatment. To that end I recommend treatment for this in the form of autoPAP, which means, that we don't have to bring her in for a sleep study with CPAP, but will let her try an autoPAP machine at home, through a DME company (of her choice, or as per insurance requirement). The DME representative will educate her on how to use the machine, how to put the mask on, etc. I have placed an order in the chart. Please send referral, talk to patient, send report to referring MD. We will need a FU in sleep clinic for 10 weeks post-PAP set up, please arrange that with me or one of our NPs. Thanks,   Huston Foley, MD, PhD [facility])

## 2019-12-31 NOTE — Procedures (Signed)
Patient Information     First Name: Mary Last Name: Howard ID: [id number]  Birth Date: 11-24-1975 Age: 44 Gender: Female  Referring Provider: Jarold Motto, PA BMI: 32.5 (W=176 lb, H=5' 2'')  Neck Circ.:  16 '' Epworth:  12/24   Sleep Study Information    Study Date: 12/07/19 S/H/A Version: [id number] / 4.1.1528 / 43  History:    44 year old woman with a history of anxiety, depression, diabetes, hyperlipidemia, smoking, asthma and obesity, who was previously diagnosed with obstructive sleep apnea several years ago and placed on CPAP therapy.  She has not used her CPAP in many years. Summary & Diagnosis:     OSA  Recommendations:      This home sleep test demonstrates overall mild obstructive sleep apnea with a total AHI of 14.3/hour and O2 nadir of 86%. Given the patient's medical history and sleep related complaints, treatment with positive airway pressure is recommended. This can be achieved in the form of autoPAP trial/titration at home. A  full night CPAP titration study will help with proper treatment settings and mask fitting if needed. Alternative treatments include weight loss along with avoidance of the supine sleep position, or an oral appliance in appropriate candidates.   Please note that untreated obstructive sleep apnea may carry additional perioperative morbidity. Patients with significant obstructive sleep apnea should receive perioperative PAP therapy and the surgeons and particularly the anesthesiologist should be informed of the diagnosis and the severity of the sleep disordered breathing. The patient should be cautioned not to drive, work at heights, or operate dangerous or heavy equipment when tired or sleepy. Review and reiteration of good sleep hygiene measures should be pursued with any patient. Other causes of the patient's symptoms, including circadian rhythm disturbances, an underlying mood disorder, medication effect and/or an underlying medical problem cannot be ruled  out based on this test. Clinical correlation is recommended.   The patient and her referring provider will be notified of the test results. The patient will be seen in follow up in sleep clinic at [facility].  I certify that I have reviewed the raw data recording prior to the issuance of this report in accordance with the standards of the American Academy of Sleep Medicine (AASM).  Huston Foley, MD, PhD [facility]) Diplomat, ABPN (Neurology and Sleep)             Sleep Summary  Oxygen Saturation Statistics   Start Study Time: End Study Time: Total Recording Time:          10:40:20 PM 6:16:33 AM   7 h, 36 min  Total Sleep Time % REM of Sleep Time:  3 h, 59 min  16.9    Mean: 94 Minimum: 86 Maximum: 98  Mean of Desaturations Nadirs (%):   91  Oxygen Desaturation. %:   4-9 10-20 >20 Total  Events Number Total    23  1 95.8 4.2  0 0.0  24 100.0  Oxygen Saturation: <90 <=88 <85 <80 <70  Duration (minutes): Sleep % 0.5 0.2  0.2 0.0  0.1 0.0 0.0 0.0 0.0 0.0     Respiratory Indices      Total Events REM NREM All Night  pRDI:  52  pAHI:  42 ODI:  24  pAHIc:  0  % CSR: 0.0 56.8 56.8 56.8 0.0 15.4 11.8 5.4 0.0 17.6 14.3 8.1 0.0       Pulse Rate Statistics during Sleep (BPM)      Mean:  79 Minimum:  56 Maximum: 105    Indices are calculated using technically valid sleep time of 3 h, 56 min. pRDI/pAHI are calculated using oxi desaturations ? 3%  Body Position Statistics  Position Supine Prone Right Left Non-Supine  Sleep (min) 143.5 10.5 81.5 3.5 95.5  Sleep % 60.0 4.4 34.1 1.5 40.0  pRDI 23.1 N/A 8.9 N/A 8.3  pAHI 18.8 N/A 7.8 N/A 6.4  ODI 11.3 N/A 3.3 N/A 2.8     Snoring Statistics Snoring Level (dB) >40 >50 >60 >70 >80 >Threshold (45)  Sleep (min) 159.5 1.3 0.1 0.0 0.0 27.3  Sleep % 66.7 0.5 0.0 0.0 0.0 11.4    Mean: 42 dB Sleep Stages Chart                      pAHI=14.2                                  Mild               Moderate                    Severe                                                 5              15                    30

## 2020-01-14 DIAGNOSIS — F902 Attention-deficit hyperactivity disorder, combined type: Secondary | ICD-10-CM | POA: Diagnosis not present

## 2020-01-14 DIAGNOSIS — Z79899 Other long term (current) drug therapy: Secondary | ICD-10-CM | POA: Diagnosis not present

## 2020-01-15 ENCOUNTER — Ambulatory Visit (INDEPENDENT_AMBULATORY_CARE_PROVIDER_SITE_OTHER): Payer: BC Managed Care – PPO | Admitting: Physician Assistant

## 2020-01-15 ENCOUNTER — Encounter: Payer: Self-pay | Admitting: Physician Assistant

## 2020-01-15 ENCOUNTER — Other Ambulatory Visit: Payer: Self-pay | Admitting: Physician Assistant

## 2020-01-15 ENCOUNTER — Other Ambulatory Visit: Payer: Self-pay

## 2020-01-15 VITALS — BP 160/94 | HR 102 | Temp 98.4°F | Ht 62.0 in | Wt 169.0 lb

## 2020-01-15 DIAGNOSIS — I10 Essential (primary) hypertension: Secondary | ICD-10-CM | POA: Diagnosis not present

## 2020-01-15 DIAGNOSIS — E1159 Type 2 diabetes mellitus with other circulatory complications: Secondary | ICD-10-CM | POA: Diagnosis not present

## 2020-01-15 DIAGNOSIS — R21 Rash and other nonspecific skin eruption: Secondary | ICD-10-CM | POA: Diagnosis not present

## 2020-01-15 DIAGNOSIS — I152 Hypertension secondary to endocrine disorders: Secondary | ICD-10-CM

## 2020-01-15 MED ORDER — MUPIROCIN CALCIUM 2 % EX CREA: TOPICAL_CREAM | CUTANEOUS | 0 refills | Status: DC

## 2020-01-15 NOTE — Patient Instructions (Signed)
It was great to see you!  Continue blood pressure medication as directed, follow-up in 3 months.  Apply bactroban cream to area and if no better, let me know.  Take care,  Jarold Motto PA-C

## 2020-01-15 NOTE — Progress Notes (Signed)
Mary Howard is a 44 y.o. female is here for follow up.  I acted as a Education administrator for [employer], PA-C Anselmo Pickler, LPN   History of Present Illness:   Chief Complaint  Patient presents with  . Hypertension    HPI    Hypertension Pt following up, was started on Losartan 25 mg last visit 06/18.Pt is checking blood pressure once a week and has been averaging 130's/80's. Pt denies headaches, dizziness, blurred vision, chest pain, SOB or lower leg edema. Denies excessive caffeine intake, pt stopped coffee, stimulant usage, excessive alcohol intake or increase in salt consumption.  BP Readings from Last 3 Encounters:  01/15/20 (!) 160/94  12/30/19 130/80  12/18/19 (!) 156/100   Rash Complains of skin discomfort to R inner thigh near labia. This occurred after shaving. Hx of genital warts but she doesn't think it is this. Denies pain, crusting, discharge from lesions or vaginal discharge.   Health Maintenance Due  Topic Date Due  . Hepatitis C Screening  Never done  . PNEUMOCOCCAL POLYSACCHARIDE VACCINE AGE 1-64 HIGH RISK  Never done  . FOOT EXAM  Never done  . OPHTHALMOLOGY EXAM  Never done  . COVID-19 Vaccine (1) Never done    Past Medical History:  Diagnosis Date  . Anxiety   . Depression   . Diabetes mellitus without complication ([place])   . Hyperlipidemia      Social History   Tobacco Use  . Smoking status: Current Some Day Smoker    Packs/day: 0.25    Years: 20.00    Pack years: 5.00  . Smokeless tobacco: Never Used  Vaping Use  . Vaping Use: Never used  Substance Use Topics  . Alcohol use: Yes    Comment: 2-3 glasses of wine a month  . Drug use: Never    Past Surgical History:  Procedure Laterality Date  . ABDOMINAL HYSTERECTOMY    . CHOLECYSTECTOMY      Family History  Problem Relation Age of Onset  . Diabetes Mother   . Bipolar disorder Mother   . Hypertension Mother   . Hyperlipidemia Father   . Heart disease Father        valve  .  Stroke Father   . Cancer Paternal Uncle        possibly stomach  . Colon cancer Paternal Grandmother 40  . Cancer Paternal Grandfather        in chest  . Bipolar disorder Sister   . Heart disease Sister        long QT    PMHx, SurgHx, SocialHx, FamHx, Medications, and Allergies were reviewed in the Visit Navigator and updated as appropriate.   Patient Active Problem List   Diagnosis Date Noted  . Hx of diabetes mellitus 04/11/2018  . BMI 33.0-33.9,adult 04/11/2018  . Genital warts 01/15/2018  . Type 2 diabetes mellitus with hyperglycemia, without long-term current use of insulin ([place]) 11/06/2016  . History of kidney stones 11/06/2016  . Moderate episode of recurrent major depressive disorder ([place]) 11/06/2016    Social History   Tobacco Use  . Smoking status: Current Some Day Smoker    Packs/day: 0.25    Years: 20.00    Pack years: 5.00  . Smokeless tobacco: Never Used  Vaping Use  . Vaping Use: Never used  Substance Use Topics  . Alcohol use: Yes    Comment: 2-3 glasses of wine a month  . Drug use: Never    Current Medications and Allergies:  Current Outpatient Medications:  .  Accu-Chek Softclix Lancets lancets, USE TO CHECK BLOOD SUGAR DAILY AND PRN, Disp: 100 each, Rfl: 4 .  amphetamine-dextroamphetamine (ADDERALL) 30 MG tablet, Take 30 mg by mouth daily. 1 capsule every day, Disp: , Rfl:  .  atorvastatin (LIPITOR) 20 MG tablet, Take 1 tablet (20 mg total) by mouth daily., Disp: 90 tablet, Rfl: 3 .  Blood Glucose Monitoring Suppl (ACCU-CHEK GUIDE ME) w/Device KIT, USE TO CHECK BLOOD SUGARS DAILY AND PRN, Disp: 1 kit, Rfl: 0 .  glucose blood (ACCU-CHEK GUIDE) test strip, USE TO CHECK BLOOD SUGAR DAILY AND PRN, Disp: 100 each, Rfl: 4 .  losartan (COZAAR) 25 MG tablet, Take 1 tablet (25 mg total) by mouth daily., Disp: 30 tablet, Rfl: 2 .  metFORMIN (GLUCOPHAGE) 500 MG tablet, Take 1 tablet (500 mg total) by mouth 2 (two) times daily with a meal., Disp: 180 tablet,  Rfl: 3 .  mometasone-formoterol (DULERA) 100-5 MCG/ACT AERO, Inhale 2 puffs into the lungs in the morning and at bedtime., Disp: 13 g, Rfl: 0 .  traZODone (DESYREL) 50 MG tablet, Take 0.5-1 tablets (25-50 mg total) by mouth at bedtime as needed for sleep., Disp: 30 tablet, Rfl: 3 .  mupirocin cream (BACTROBAN) 2 %, Apply 1-2 times daily, Disp: 15 g, Rfl: 0  No Known Allergies  Review of Systems   ROS Negative unless otherwise specified per HPI.  Vitals:   Vitals:   01/15/20 1315  BP: (!) 160/94  Pulse: (!) 102  Temp: 98.4 F (36.9 C)  TempSrc: Temporal  SpO2: 96%  Weight: 169 lb (76.7 kg)  Height: '5\' 2"'$  (1.575 m)     Body mass index is 30.91 kg/m.   Physical Exam:    Physical Exam Vitals and nursing note reviewed.  Constitutional:      General: She is not in acute distress.    Appearance: She is well-developed. She is not ill-appearing or toxic-appearing.  Cardiovascular:     Rate and Rhythm: Normal rate and regular rhythm.     Pulses: Normal pulses.     Heart sounds: Normal heart sounds, S1 normal and S2 normal.     Comments: No LE edema Pulmonary:     Effort: Pulmonary effort is normal.     Breath sounds: Normal breath sounds.  Skin:    General: Skin is warm and dry.     Comments: Approximately 3 erythematous papules in bikini hair line without crusting or active discharge  Neurological:     Mental Status: She is alert.     GCS: GCS eye subscore is 4. GCS verbal subscore is 5. GCS motor subscore is 6.  Psychiatric:        Speech: Speech normal.        Behavior: Behavior normal. Behavior is cooperative.      Assessment and Plan:    Mary Howard was seen today for hypertension.  Diagnoses and all orders for this visit:  Hypertension associated with diabetes ([place]) Uncontrolled on our readings but she states that her home readings are more accurate. Offered increase in medication however patient declined. She is taking 25 mg daily, and would like to continue  this dose. Follow-up in 3 months, sooner if concerns.  Rash Suspect folliculitis. Will trial topical bactroban. Follow-up if symptoms persist despite treatment, or if they worsen.  Other orders -     mupirocin cream (BACTROBAN) 2 %; Apply 1-2 times daily  . Reviewed expectations re: course of current medical issues. Marland Kitchen  Discussed self-management of symptoms. . Outlined signs and symptoms indicating need for more acute intervention. . Patient verbalized understanding and all questions were answered. . See orders for this visit as documented in the electronic medical record. . Patient received an After Visit Summary.  CMA or LPN served as scribe during this visit. History, Physical, and Plan performed by medical provider. The above documentation has been reviewed and is accurate and complete.  I spent 25 minutes with this patient, greater than 50% was face-to-face time counseling regarding the above diagnoses.   Inda Coke, PA-C [place], [facility] [place] [facility] 01/15/2020  Follow-up: No follow-ups on file.

## 2020-01-15 NOTE — Telephone Encounter (Signed)
Please see message from pharmacy.

## 2020-01-20 ENCOUNTER — Other Ambulatory Visit: Payer: Self-pay | Admitting: Family Medicine

## 2020-01-21 ENCOUNTER — Other Ambulatory Visit: Payer: Self-pay | Admitting: Family Medicine

## 2020-01-21 ENCOUNTER — Telehealth: Payer: Self-pay

## 2020-01-21 NOTE — Telephone Encounter (Signed)
error 

## 2020-01-25 ENCOUNTER — Other Ambulatory Visit: Payer: Self-pay

## 2020-01-25 ENCOUNTER — Encounter: Payer: Self-pay | Admitting: Physician Assistant

## 2020-01-25 ENCOUNTER — Telehealth: Payer: BC Managed Care – PPO | Admitting: Physician Assistant

## 2020-01-25 VITALS — Ht 62.0 in | Wt 169.0 lb

## 2020-01-25 DIAGNOSIS — E1165 Type 2 diabetes mellitus with hyperglycemia: Secondary | ICD-10-CM

## 2020-01-25 MED ORDER — METFORMIN HCL 500 MG PO TABS: 500.0000 mg | ORAL_TABLET | Freq: Two times a day (BID) | ORAL | 3 refills | Status: DC

## 2020-01-25 NOTE — Progress Notes (Signed)
Virtual Visit via Video   I connected with Mary Howard on 01/25/20 at  7:30 AM EDT by a video enabled telemedicine application and verified that I am speaking with the correct person using two identifiers. Location patient: Home Location provider: [facility], Office Persons participating in the virtual visit: Mary Howard, Pontius PA-C, Mary Pickler, LPN   I discussed the limitations of evaluation and management by telemedicine and the availability of in person appointments. The patient expressed understanding and agreed to proceed.  I acted as a Education administrator for [employer], PA-C [employer], LPN   Subjective:   HPI:   Medication refill Pt requesting refill for Metformin 500 mg BID prescribed by her prior PCP. The patient ran out on Friday. Pt tolerating medication well.   Lab Results  Component Value Date   HGBA1C 7.4 (H) 08/04/2019     ROS: See pertinent positives and negatives per HPI.  Patient Active Problem List   Diagnosis Date Noted   Hx of diabetes mellitus 04/11/2018   BMI 33.0-33.9,adult 04/11/2018   Genital warts 01/15/2018   Type 2 diabetes mellitus with hyperglycemia, without long-term current use of insulin ([place]) 11/06/2016   History of kidney stones 11/06/2016   Moderate episode of recurrent major depressive disorder ([place]) 11/06/2016    Social History   Tobacco Use   Smoking status: Current Some Day Smoker    Packs/day: 0.25    Years: 20.00    Pack years: 5.00   Smokeless tobacco: Never Used  Substance Use Topics   Alcohol use: Yes    Comment: 2-3 glasses of wine a month    Current Outpatient Medications:    Accu-Chek Softclix Lancets lancets, USE TO CHECK BLOOD SUGAR DAILY AND PRN, Disp: 100 each, Rfl: 4   amphetamine-dextroamphetamine (ADDERALL) 30 MG tablet, Take 30 mg by mouth daily. 1 capsule every day, Disp: , Rfl:    atorvastatin (LIPITOR) 20 MG tablet, Take 1 tablet (20 mg total) by mouth daily., Disp: 90  tablet, Rfl: 3   Blood Glucose Monitoring Suppl (ACCU-CHEK GUIDE ME) w/Device KIT, USE TO CHECK BLOOD SUGARS DAILY AND PRN, Disp: 1 kit, Rfl: 0   glucose blood (ACCU-CHEK GUIDE) test strip, USE TO CHECK BLOOD SUGAR DAILY AND PRN, Disp: 100 each, Rfl: 4   losartan (COZAAR) 25 MG tablet, Take 1 tablet (25 mg total) by mouth daily., Disp: 30 tablet, Rfl: 2   metFORMIN (GLUCOPHAGE) 500 MG tablet, Take 1 tablet (500 mg total) by mouth 2 (two) times daily with a meal., Disp: 180 tablet, Rfl: 3   mometasone-formoterol (DULERA) 100-5 MCG/ACT AERO, Inhale 2 puffs into the lungs in the morning and at bedtime., Disp: 13 g, Rfl: 0   traZODone (DESYREL) 50 MG tablet, Take 0.5-1 tablets (25-50 mg total) by mouth at bedtime as needed for sleep., Disp: 30 tablet, Rfl: 3  No Known Allergies  Objective:   VITALS: Per patient if applicable, see vitals. GENERAL: Alert, appears well and in no acute distress. HEENT: Atraumatic, conjunctiva clear, no obvious abnormalities on inspection of external nose and ears. NECK: Normal movements of the head and neck. CARDIOPULMONARY: No increased WOB. Speaking in clear sentences. I:E ratio WNL.  MS: Moves all visible extremities without noticeable abnormality. PSYCH: Pleasant and cooperative, well-groomed. Speech normal rate and rhythm. Affect is appropriate. Insight and judgement are appropriate. Attention is focused, linear, and appropriate.  NEURO: CN grossly intact. Oriented as arrived to appointment on time with no prompting. Moves both UE equally.  SKIN: No obvious lesions, wounds, erythema, or cyanosis noted on face or hands.  Assessment and Plan:   Mary Howard was seen today for medication refill.  Diagnoses and all orders for this visit:  Type 2 diabetes mellitus with hyperglycemia, without long-term current use of insulin (HCC)   Refill of medication is appropriate. Patient actually called our office on Friday to see if we could refill this medication, and  since I am currently managing her diabetes, a refill should have been sent instead of an appointment being made.  Refill of metformin has been sent.  No charge for today's visit.   Reviewed expectations re: course of current medical issues.  Discussed self-management of symptoms.  Outlined signs and symptoms indicating need for more acute intervention.  Patient verbalized understanding and all questions were answered.  Health Maintenance issues including appropriate healthy diet, exercise, and smoking avoidance were discussed with patient.  See orders for this visit as documented in the electronic medical record.  I discussed the assessment and treatment plan with the patient. The patient was provided an opportunity to ask questions and all were answered. The patient agreed with the plan and demonstrated an understanding of the instructions.   The patient was advised to call back or seek an in-person evaluation if the symptoms worsen or if the condition fails to improve as anticipated.   CMA or LPN served as scribe during this visit. History, Physical, and Plan performed by medical provider. The above documentation has been reviewed and is accurate and complete.   [place], [place] 01/25/2020

## 2020-02-09 ENCOUNTER — Other Ambulatory Visit: Payer: Self-pay | Admitting: Physician Assistant

## 2020-02-09 ENCOUNTER — Encounter: Payer: Self-pay | Admitting: Physician Assistant

## 2020-02-09 MED ORDER — FLUCONAZOLE 150 MG PO TABS
ORAL_TABLET | ORAL | 0 refills | Status: DC
Start: 2020-02-09 — End: 2020-04-12

## 2020-02-10 ENCOUNTER — Telehealth (HOSPITAL_COMMUNITY): Payer: Self-pay | Admitting: *Deleted

## 2020-02-10 NOTE — Telephone Encounter (Signed)
Patient given detailed instructions per Stress Test Requisition Sheet for test on 02/15/2020 at 1400.Patient Notified to arrive 30 minutes early, and that it is imperative to arrive on time for appointment to keep from having the test rescheduled.  Patient verbalized understanding. Mary Howard, Mary Howard

## 2020-02-15 ENCOUNTER — Ambulatory Visit (HOSPITAL_COMMUNITY): Payer: BC Managed Care – PPO

## 2020-02-15 ENCOUNTER — Ambulatory Visit (HOSPITAL_COMMUNITY): Payer: BC Managed Care – PPO | Attending: Internal Medicine

## 2020-02-15 ENCOUNTER — Other Ambulatory Visit: Payer: Self-pay

## 2020-02-15 DIAGNOSIS — R079 Chest pain, unspecified: Secondary | ICD-10-CM | POA: Insufficient documentation

## 2020-02-15 MED ORDER — PERFLUTREN LIPID MICROSPHERE
1.0000 mL | INTRAVENOUS | Status: AC | PRN
  Administered 2020-02-15: 2 mL via INTRAVENOUS
  Administered 2020-02-15 (×2): 3 mL via INTRAVENOUS

## 2020-03-23 ENCOUNTER — Ambulatory Visit: Payer: BC Managed Care – PPO | Admitting: Neurology

## 2020-04-11 ENCOUNTER — Encounter: Payer: Self-pay | Admitting: Physician Assistant

## 2020-04-12 ENCOUNTER — Other Ambulatory Visit: Payer: Self-pay | Admitting: Physician Assistant

## 2020-04-12 MED ORDER — FLUCONAZOLE 150 MG PO TABS: ORAL_TABLET | ORAL | 0 refills | Status: DC

## 2020-04-25 ENCOUNTER — Ambulatory Visit: Payer: BC Managed Care – PPO | Admitting: Neurology

## 2020-04-26 ENCOUNTER — Encounter: Payer: Self-pay | Admitting: Physician Assistant

## 2020-04-26 ENCOUNTER — Ambulatory Visit (INDEPENDENT_AMBULATORY_CARE_PROVIDER_SITE_OTHER): Payer: BC Managed Care – PPO | Admitting: Physician Assistant

## 2020-04-26 ENCOUNTER — Other Ambulatory Visit: Payer: Self-pay

## 2020-04-26 VITALS — BP 136/80 | HR 95 | Temp 98.5°F | Ht 62.0 in | Wt 162.2 lb

## 2020-04-26 DIAGNOSIS — E1165 Type 2 diabetes mellitus with hyperglycemia: Secondary | ICD-10-CM | POA: Diagnosis not present

## 2020-04-26 LAB — POCT GLYCOSYLATED HEMOGLOBIN (HGB A1C): Hemoglobin A1C: 7.4 % — AB (ref 4.0–5.6)

## 2020-04-26 MED ORDER — METFORMIN HCL 500 MG PO TABS: ORAL_TABLET | ORAL | 3 refills | Status: AC

## 2020-04-26 NOTE — Progress Notes (Signed)
Mary Howard is a 44 y.o. female is here to discuss:  I acted as a Education administrator for [employer], PA-C Anselmo Pickler, LPN   History of Present Illness:   Chief Complaint  Patient presents with  . Diabetes    HPI   Diabetes Pt here for follow up, currently taking Metformin 500 mg BID. She was checking sugars but has not checked in a few weeks, was running 110-112. Denies hypoglycemia. Doesn't drink a ton of sugary drinks, rare soda and juice. Main issue is not exercising.  Was on a combination medication that caused significant stomach issues -- Metaglip.    Health Maintenance Due  Topic Date Due  . Hepatitis C Screening  Never done  . OPHTHALMOLOGY EXAM  Never done    Past Medical History:  Diagnosis Date  . Anxiety   . Depression   . Diabetes mellitus without complication ([place])   . Hyperlipidemia      Social History   Tobacco Use  . Smoking status: Current Some Day Smoker    Packs/day: 0.25    Years: 20.00    Pack years: 5.00  . Smokeless tobacco: Never Used  Vaping Use  . Vaping Use: Never used  Substance Use Topics  . Alcohol use: Yes    Comment: 2-3 glasses of wine a month  . Drug use: Never    Past Surgical History:  Procedure Laterality Date  . ABDOMINAL HYSTERECTOMY    . CHOLECYSTECTOMY      Family History  Problem Relation Age of Onset  . Diabetes Mother   . Bipolar disorder Mother   . Hypertension Mother   . Hyperlipidemia Father   . Heart disease Father        valve  . Stroke Father   . Cancer Paternal Uncle        possibly stomach  . Colon cancer Paternal Grandmother 76  . Cancer Paternal Grandfather        in chest  . Bipolar disorder Sister   . Heart disease Sister        long QT    PMHx, SurgHx, SocialHx, FamHx, Medications, and Allergies were reviewed in the Visit Navigator and updated as appropriate.   Patient Active Problem List   Diagnosis Date Noted  . Hx of diabetes mellitus 04/11/2018  . BMI 33.0-33.9,adult  04/11/2018  . Genital warts 01/15/2018  . Type 2 diabetes mellitus with hyperglycemia, without long-term current use of insulin ([place]) 11/06/2016  . History of kidney stones 11/06/2016  . Moderate episode of recurrent major depressive disorder ([place]) 11/06/2016    Social History   Tobacco Use  . Smoking status: Current Some Day Smoker    Packs/day: 0.25    Years: 20.00    Pack years: 5.00  . Smokeless tobacco: Never Used  Vaping Use  . Vaping Use: Never used  Substance Use Topics  . Alcohol use: Yes    Comment: 2-3 glasses of wine a month  . Drug use: Never    Current Medications and Allergies:    Current Outpatient Medications:  .  Accu-Chek Softclix Lancets lancets, USE TO CHECK BLOOD SUGAR DAILY AND PRN, Disp: 100 each, Rfl: 4 .  amphetamine-dextroamphetamine (ADDERALL) 30 MG tablet, Take 30 mg by mouth daily. 1 capsule every day, Disp: , Rfl:  .  atorvastatin (LIPITOR) 20 MG tablet, Take 1 tablet (20 mg total) by mouth daily., Disp: 90 tablet, Rfl: 3 .  Blood Glucose Monitoring Suppl (ACCU-CHEK GUIDE ME) w/Device KIT,  USE TO CHECK BLOOD SUGARS DAILY AND PRN, Disp: 1 kit, Rfl: 0 .  glucose blood (ACCU-CHEK GUIDE) test strip, USE TO CHECK BLOOD SUGAR DAILY AND PRN, Disp: 100 each, Rfl: 4 .  losartan (COZAAR) 25 MG tablet, Take 1 tablet (25 mg total) by mouth daily., Disp: 30 tablet, Rfl: 2 .  mometasone-formoterol (DULERA) 100-5 MCG/ACT AERO, Inhale 2 puffs into the lungs in the morning and at bedtime., Disp: 13 g, Rfl: 0 .  traZODone (DESYREL) 50 MG tablet, Take 0.5-1 tablets (25-50 mg total) by mouth at bedtime as needed for sleep., Disp: 30 tablet, Rfl: 3 .  metFORMIN (GLUCOPHAGE) 500 MG tablet, Take two tablets in the AM, and one tablet in the PM., Disp: 180 tablet, Rfl: 3  No Known Allergies  Review of Systems   ROS  Negative unless otherwise specified per HPI.  Vitals:   Vitals:   04/26/20 1309  BP: 136/80  Pulse: 95  Temp: 98.5 F (36.9 C)  TempSrc: Temporal   SpO2: 97%  Weight: 162 lb 4 oz (73.6 kg)  Height: $Remove'5\' 2"'[id number]$  (1.575 m)     Body mass index is 29.68 kg/m.   Physical Exam:    Physical Exam Vitals and nursing note reviewed.  Constitutional:      General: She is not in acute distress.    Appearance: She is well-developed. She is not ill-appearing or toxic-appearing.  Cardiovascular:     Rate and Rhythm: Normal rate and regular rhythm.     Pulses: Normal pulses.     Heart sounds: Normal heart sounds, S1 normal and S2 normal.     Comments: No LE edema Pulmonary:     Effort: Pulmonary effort is normal.     Breath sounds: Normal breath sounds.  Skin:    General: Skin is warm and dry.  Neurological:     Mental Status: She is alert.     GCS: GCS eye subscore is 4. GCS verbal subscore is 5. GCS motor subscore is 6.  Psychiatric:        Speech: Speech normal.        Behavior: Behavior normal. Behavior is cooperative.      Diabetic Foot Exam - Simple   Simple Foot Form Diabetic Foot exam was performed with the following findings: Yes 04/26/2020  1:22 PM  Visual Inspection No deformities, no ulcerations, no other skin breakdown bilaterally: Yes Sensation Testing Intact to touch and monofilament testing bilaterally: Yes Pulse Check Posterior Tibialis and Dorsalis pulse intact bilaterally: Yes Comments    Results for orders placed or performed in visit on 04/26/20  POCT glycosylated hemoglobin (Hb A1C)  Result Value Ref Range   Hemoglobin A1C 7.4 (A) 4.0 - 5.6 %   HbA1c POC (<> result, manual entry)     HbA1c, POC (prediabetic range)     HbA1c, POC (controlled diabetic range)       Assessment and Plan:    Mary Howard was seen today for diabetes.  Diagnoses and all orders for this visit:  Type 2 diabetes mellitus with hyperglycemia, without long-term current use of insulin (HCC) -     POCT glycosylated hemoglobin (Hb A1C)  Uncontrolled. She is reluctant to trial new medication, but would be great candidate for  GLP-1. Increase Metformin to 1000 mg in AM and 500 mg in PM. Follow-up in 3-6 months, sooner if concerns.  Other orders -     metFORMIN (GLUCOPHAGE) 500 MG tablet; Take two tablets in the AM, and one tablet in  the PM.  CMA or LPN served as scribe during this visit. History, Physical, and Plan performed by medical provider. The above documentation has been reviewed and is accurate and complete.  Inda Coke, PA-C [place], [facility] 04/26/2020  Follow-up: No follow-ups on file.

## 2020-04-28 ENCOUNTER — Telehealth: Payer: Self-pay | Admitting: Neurology

## 2020-04-28 ENCOUNTER — Encounter: Payer: Self-pay | Admitting: Neurology

## 2020-04-28 NOTE — Telephone Encounter (Signed)
Called the patient to advise that her apt scheduled for Monday 11/1 was for auto cpap. Informed the patient that I couldn't see where she had started the machine. LVM asking if pt has started.   **If patient calls back please ask if auto CPAP was started, if so make sure that this is appt is within the 31-90 day after starting the machine. If its not been started please advise the apt should be pushed out to fall within that time frame of starting the machine.

## 2020-05-02 ENCOUNTER — Encounter: Payer: Self-pay | Admitting: Neurology

## 2020-05-02 ENCOUNTER — Telehealth: Payer: Self-pay

## 2020-05-02 ENCOUNTER — Ambulatory Visit: Payer: BC Managed Care – PPO | Admitting: Neurology

## 2020-05-02 NOTE — Telephone Encounter (Signed)
Patient no-showed today's appointment with Dr. Athar.   

## 2020-05-09 DIAGNOSIS — F810 Specific reading disorder: Secondary | ICD-10-CM | POA: Diagnosis not present

## 2020-05-09 DIAGNOSIS — F900 Attention-deficit hyperactivity disorder, predominantly inattentive type: Secondary | ICD-10-CM | POA: Diagnosis not present

## 2020-08-08 ENCOUNTER — Ambulatory Visit (INDEPENDENT_AMBULATORY_CARE_PROVIDER_SITE_OTHER): Payer: BC Managed Care – PPO | Admitting: Physician Assistant

## 2020-08-08 ENCOUNTER — Encounter: Payer: Self-pay | Admitting: Physician Assistant

## 2020-08-08 ENCOUNTER — Other Ambulatory Visit: Payer: Self-pay

## 2020-08-08 VITALS — BP 130/90 | HR 90 | Temp 98.3°F | Ht 62.0 in | Wt 169.0 lb

## 2020-08-08 DIAGNOSIS — M549 Dorsalgia, unspecified: Secondary | ICD-10-CM | POA: Diagnosis not present

## 2020-08-08 MED ORDER — PREDNISONE 20 MG PO TABS: 40.0000 mg | ORAL_TABLET | Freq: Every day | ORAL | 0 refills | Status: AC

## 2020-08-08 MED ORDER — CYCLOBENZAPRINE HCL 5 MG PO TABS: 5.0000 mg | ORAL_TABLET | Freq: Three times a day (TID) | ORAL | 1 refills | Status: AC | PRN

## 2020-08-08 NOTE — Progress Notes (Signed)
Mary Howard is a 45 y.o. female here for a new problem.  I acted as a Education administrator for [employer], PA-C Anselmo Pickler, LPN   History of Present Illness:   Chief Complaint  Patient presents with  . Back Pain    HPI   Back pain Pt c/o low back pain more on leftside started 2 weeks ago. Pt was just lifting a TV tray and had a sharp pain and felt like she pinched a nerve.  Pt states she feels knots in her lower back. Has been using Ibuprofen and heating pad with little relief. Denies radiation of pain, numbness or tingling in legs, hematuria, pelvic pain, bowel/bladder incontinence, fevers.  Did have some improvement over Friday but when she had a long drive over the weekend, symptoms returned.   Past Medical History:  Diagnosis Date  . Anxiety   . Depression   . Diabetes mellitus without complication ([place])   . Hyperlipidemia      Social History   Tobacco Use  . Smoking status: Current Some Day Smoker    Packs/day: 0.25    Years: 20.00    Pack years: 5.00  . Smokeless tobacco: Never Used  Vaping Use  . Vaping Use: Never used  Substance Use Topics  . Alcohol use: Yes    Comment: 2-3 glasses of wine a month  . Drug use: Never    Past Surgical History:  Procedure Laterality Date  . ABDOMINAL HYSTERECTOMY    . CHOLECYSTECTOMY      Family History  Problem Relation Age of Onset  . Diabetes Mother   . Bipolar disorder Mother   . Hypertension Mother   . Hyperlipidemia Father   . Heart disease Father        valve  . Stroke Father   . Cancer Paternal Uncle        possibly stomach  . Colon cancer Paternal Grandmother 3  . Cancer Paternal Grandfather        in chest  . Bipolar disorder Sister   . Heart disease Sister        long QT    No Known Allergies  Current Medications:   Current Outpatient Medications:  .  Accu-Chek Softclix Lancets lancets, USE TO CHECK BLOOD SUGAR DAILY AND PRN, Disp: 100 each, Rfl: 4 .  amphetamine-dextroamphetamine (ADDERALL) 30  MG tablet, Take 30 mg by mouth daily. 1 capsule every day, Disp: , Rfl:  .  atorvastatin (LIPITOR) 20 MG tablet, Take 1 tablet (20 mg total) by mouth daily., Disp: 90 tablet, Rfl: 3 .  Blood Glucose Monitoring Suppl (ACCU-CHEK GUIDE ME) w/Device KIT, USE TO CHECK BLOOD SUGARS DAILY AND PRN, Disp: 1 kit, Rfl: 0 .  cyclobenzaprine (FLEXERIL) 5 MG tablet, Take 1 tablet (5 mg total) by mouth 3 (three) times daily as needed for muscle spasms., Disp: 30 tablet, Rfl: 1 .  glucose blood (ACCU-CHEK GUIDE) test strip, USE TO CHECK BLOOD SUGAR DAILY AND PRN, Disp: 100 each, Rfl: 4 .  losartan (COZAAR) 25 MG tablet, Take 1 tablet (25 mg total) by mouth daily., Disp: 30 tablet, Rfl: 2 .  metFORMIN (GLUCOPHAGE) 500 MG tablet, Take two tablets in the AM, and one tablet in the PM., Disp: 180 tablet, Rfl: 3 .  mometasone-formoterol (DULERA) 100-5 MCG/ACT AERO, Inhale 2 puffs into the lungs in the morning and at bedtime., Disp: 13 g, Rfl: 0 .  predniSONE (DELTASONE) 20 MG tablet, Take 2 tablets (40 mg total) by mouth daily., Disp:  10 tablet, Rfl: 0 .  traZODone (DESYREL) 50 MG tablet, Take 0.5-1 tablets (25-50 mg total) by mouth at bedtime as needed for sleep., Disp: 30 tablet, Rfl: 3   Review of Systems:   ROS  Negative unless otherwise specified per HPI.  Vitals:   Vitals:   08/08/20 1129  BP: 130/90  Pulse: 90  Temp: 98.3 F (36.8 C)  TempSrc: Temporal  SpO2: 97%  Weight: 169 lb (76.7 kg)  Height: $Remove'5\' 2"'DGLVRnY$  (1.575 m)     Body mass index is 30.91 kg/m.  Physical Exam:   Physical Exam Vitals and nursing note reviewed.  Constitutional:      General: She is not in acute distress.    Appearance: She is well-developed and well-nourished. She is not ill-appearing, toxic-appearing or sickly-appearing.  HENT:     Head: Normocephalic and atraumatic.  Cardiovascular:     Rate and Rhythm: Normal rate and regular rhythm.     Heart sounds: Normal heart sounds.  Pulmonary:     Effort: Pulmonary effort is  normal. No accessory muscle usage or respiratory distress.     Breath sounds: Normal breath sounds.  Musculoskeletal:     Comments: No decreased ROM 2/2 pain with flexion/extension, lateral side bends, or rotation. Reproducible tenderness with deep palpation to bilateral paraspinal muscles. Palpable spasm to R lumbar paraspinal muscle. No bony tenderness. No evidence of erythema, rash or ecchymosis.    Skin:    General: Skin is warm, dry and intact.  Neurological:     Mental Status: She is alert.  Psychiatric:        Mood and Affect: Mood and affect normal.        Speech: Speech normal.        Behavior: Behavior is cooperative.       Assessment and Plan:   Adena was seen today for back pain.  Diagnoses and all orders for this visit:  Back pain, unspecified back location, unspecified back pain laterality, unspecified chronicity No red flags or systemic signs of illness. Suspect muscle spasm. Trial prednisone 40 mg x 5 days. She is diabetic and knows that she needs to closely watch sugars when on prednisone. Hold NSAIDs while on prednisone. May take flexeril 5 mg TID prn for her spasm, can increase to 10 mg if needed. Follow-up if worsening. If no improvement, will refer to sports medicine.  Other orders -     predniSONE (DELTASONE) 20 MG tablet; Take 2 tablets (40 mg total) by mouth daily. -     cyclobenzaprine (FLEXERIL) 5 MG tablet; Take 1 tablet (5 mg total) by mouth 3 (three) times daily as needed for muscle spasms.    CMA or LPN served as scribe during this visit. History, Physical, and Plan performed by medical provider. The above documentation has been reviewed and is accurate and complete.   Inda Coke, PA-C

## 2020-08-08 NOTE — Patient Instructions (Signed)
It was great to see you!  Start prednisone. Hold adderall and ibuprofen Tylenol is ok  Start 5 mg flexeril up 3 times a day, may increase to 10 mg if tolerated  If no better or worsening, please let me know and we will get you to sports medicine  Take care,  Jarold Motto PA-C

## 2020-10-31 DIAGNOSIS — Z79899 Other long term (current) drug therapy: Secondary | ICD-10-CM | POA: Diagnosis not present

## 2020-10-31 DIAGNOSIS — F902 Attention-deficit hyperactivity disorder, combined type: Secondary | ICD-10-CM | POA: Diagnosis not present

## 2020-11-07 ENCOUNTER — Encounter: Payer: Self-pay | Admitting: Physician Assistant

## 2020-11-07 ENCOUNTER — Telehealth (INDEPENDENT_AMBULATORY_CARE_PROVIDER_SITE_OTHER): Payer: BC Managed Care – PPO | Admitting: Physician Assistant

## 2020-11-07 DIAGNOSIS — N760 Acute vaginitis: Secondary | ICD-10-CM | POA: Diagnosis not present

## 2020-11-07 MED ORDER — FLUCONAZOLE 150 MG PO TABS: 150.0000 mg | ORAL_TABLET | Freq: Once | ORAL | 0 refills | Status: AC

## 2020-11-07 NOTE — Progress Notes (Signed)
Virtual Visit via Video   I connected with Mary Howard on 11/07/20 at 11:00 AM EDT by a video enabled telemedicine application and verified that I am speaking with the correct person using two identifiers. Location patient: Home Location provider: [facility], Office Persons participating in the virtual visit: [place], Beissel PA-C  I discussed the limitations of evaluation and management by telemedicine and the availability of in person appointments. The patient expressed understanding and agreed to proceed.  Subjective:   HPI:   Yeast vaginitis Patient reports that she was in a hot tub over the weekend and developed irritation to her vagina.  She denies any other changes in her environment.  She gets yeast infections quite often, and symptoms today are consistent with past episodes.  Denies: Fever, chills, malaise, dysuria, back pain, hematuria, vaginal odor  She has not had any vaginal discharge yet, but states that she anticipates that she will have some symptoms are consistent with past episodes.  ROS: See pertinent positives and negatives per HPI.  Patient Active Problem List   Diagnosis Date Noted  . Hx of diabetes mellitus 04/11/2018  . BMI 33.0-33.9,adult 04/11/2018  . Genital warts 01/15/2018  . Type 2 diabetes mellitus with hyperglycemia, without long-term current use of insulin ([place]) 11/06/2016  . History of kidney stones 11/06/2016  . Moderate episode of recurrent major depressive disorder ([place]) 11/06/2016    Social History   Tobacco Use  . Smoking status: Current Some Day Smoker    Packs/day: 0.25    Years: 20.00    Pack years: 5.00  . Smokeless tobacco: Never Used  Substance Use Topics  . Alcohol use: Yes    Comment: 2-3 glasses of wine a month    Current Outpatient Medications:  .  fluconazole (DIFLUCAN) 150 MG tablet, Take 1 tablet (150 mg total) by mouth once for 1 dose. May repeat in 1 week if needed, Disp: 2 tablet, Rfl: 0 .   Accu-Chek Softclix Lancets lancets, USE TO CHECK BLOOD SUGAR DAILY AND PRN, Disp: 100 each, Rfl: 4 .  amphetamine-dextroamphetamine (ADDERALL) 30 MG tablet, Take 30 mg by mouth daily. 1 capsule every day, Disp: , Rfl:  .  atorvastatin (LIPITOR) 20 MG tablet, Take 1 tablet (20 mg total) by mouth daily., Disp: 90 tablet, Rfl: 3 .  Blood Glucose Monitoring Suppl (ACCU-CHEK GUIDE ME) w/Device KIT, USE TO CHECK BLOOD SUGARS DAILY AND PRN, Disp: 1 kit, Rfl: 0 .  cyclobenzaprine (FLEXERIL) 5 MG tablet, Take 1 tablet (5 mg total) by mouth 3 (three) times daily as needed for muscle spasms., Disp: 30 tablet, Rfl: 1 .  glucose blood (ACCU-CHEK GUIDE) test strip, USE TO CHECK BLOOD SUGAR DAILY AND PRN, Disp: 100 each, Rfl: 4 .  losartan (COZAAR) 25 MG tablet, Take 1 tablet (25 mg total) by mouth daily., Disp: 30 tablet, Rfl: 2 .  metFORMIN (GLUCOPHAGE) 500 MG tablet, Take two tablets in the AM, and one tablet in the PM., Disp: 180 tablet, Rfl: 3 .  mometasone-formoterol (DULERA) 100-5 MCG/ACT AERO, Inhale 2 puffs into the lungs in the morning and at bedtime., Disp: 13 g, Rfl: 0 .  predniSONE (DELTASONE) 20 MG tablet, Take 2 tablets (40 mg total) by mouth daily., Disp: 10 tablet, Rfl: 0 .  traZODone (DESYREL) 50 MG tablet, Take 0.5-1 tablets (25-50 mg total) by mouth at bedtime as needed for sleep., Disp: 30 tablet, Rfl: 3  No Known Allergies  Objective:   VITALS: Per patient if applicable,  see vitals. GENERAL: Alert, appears well and in no acute distress. HEENT: Atraumatic, conjunctiva clear, no obvious abnormalities on inspection of external nose and ears. NECK: Normal movements of the head and neck. CARDIOPULMONARY: No increased WOB. Speaking in clear sentences. I:E ratio WNL.  MS: Moves all visible extremities without noticeable abnormality. PSYCH: Pleasant and cooperative, well-groomed. Speech normal rate and rhythm. Affect is appropriate. Insight and judgement are appropriate. Attention is focused,  linear, and appropriate.  NEURO: CN grossly intact. Oriented as arrived to appointment on time with no prompting. Moves both UE equally.  SKIN: No obvious lesions, wounds, erythema, or cyanosis noted on face or hands.  Assessment and Plan:   Shanay was seen today for vaginitis.  Diagnoses and all orders for this visit:  Acute vaginitis  Other orders -     fluconazole (DIFLUCAN) 150 MG tablet; Take 1 tablet (150 mg total) by mouth once for 1 dose. May repeat in 1 week if needed   No red flags on discussion. Will trial Diflucan, 1 tablet now and may repeat in 1 week if needed. Discussed that if symptoms persist, will require an office visit for further evaluation management.  I discussed the assessment and treatment plan with the patient. The patient was provided an opportunity to ask questions and all were answered. The patient agreed with the plan and demonstrated an understanding of the instructions.   The patient was advised to call back or seek an in-person evaluation if the symptoms worsen or if the condition fails to improve as anticipated.   [place], [place] 11/07/2020

## 2020-11-07 NOTE — Telephone Encounter (Signed)
Appt scheduled

## 2020-11-30 ENCOUNTER — Encounter: Payer: Self-pay | Admitting: Physician Assistant

## 2020-12-01 ENCOUNTER — Telehealth (INDEPENDENT_AMBULATORY_CARE_PROVIDER_SITE_OTHER): Payer: BC Managed Care – PPO | Admitting: Family Medicine

## 2020-12-01 DIAGNOSIS — N760 Acute vaginitis: Secondary | ICD-10-CM

## 2020-12-01 MED ORDER — FLUCONAZOLE 150 MG PO TABS: ORAL_TABLET | ORAL | 0 refills | Status: AC

## 2020-12-01 NOTE — Patient Instructions (Signed)
-  I sent the medication(s) we discussed to your pharmacy: Meds ordered this encounter  Medications  . fluconazole (DIFLUCAN) 150 MG tablet    Sig: Take one tablet now. May repeat once if needed.    Dispense:  2 tablet    Refill:  0   Schedule a follow up in 1-2 weeks with your primary care office or your gynecologist for an inperson exam.   I hope you are feeling better soon!  Seek in person care promptly sooner if your symptoms worsen, new concerns arise or you are not improving with treatment.  It was nice to meet you today. I help [facility] out with telemedicine visits on Tuesdays and Thursdays and am available for visits on those days. If you have any concerns or questions following this visit please schedule a follow up visit with your Primary Care doctor or seek care at a local urgent care clinic to avoid delays in care.

## 2020-12-01 NOTE — Progress Notes (Signed)
Virtual Visit via Video Note  I connected with Mary Howard  on 12/01/20 at  4:20 PM EDT by a video enabled telemedicine application and verified that I am speaking with the correct person using two identifiers.  Location patient: home, [place] Location provider:work or home office Persons participating in the virtual visit: patient, provider  I discussed the limitations of evaluation and management by telemedicine and the availability of in person appointments. The patient expressed understanding and agreed to proceed.   HPI:  Acute telemedicine visit for "yeast infection": -reports prone to yeast infections ever since she was diagnosed with diabetes -getting in jacuzzi seems to trigger this -she had a yeast infection last month and diflucan resolved the issues promptly -she went in a jacuzzi a few days ago and now started having symptoms again -Symptoms include: mild pruritis of the vulvovaginal area -Denies: discharge, dysuria, pelvic pain/abd pain, concern for STI, new sexual partners, fevers, malaise, rash, ulcer, skin changes other then just a little red -she gets yearly exam with her gynecologist and sti testing with them -Has tried: none - reports the OTC creams never work for her -Pertinent past medical history: see below -Pertinent medication allergies: No Known Allergies  ROS: See pertinent positives and negatives per HPI.  Past Medical History:  Diagnosis Date  . Anxiety   . Depression   . Diabetes mellitus without complication ([place])   . Hyperlipidemia     Past Surgical History:  Procedure Laterality Date  . ABDOMINAL HYSTERECTOMY    . CHOLECYSTECTOMY       Current Outpatient Medications:  .  fluconazole (DIFLUCAN) 150 MG tablet, Take one tablet now. May repeat once if needed., Disp: 2 tablet, Rfl: 0 .  Accu-Chek Softclix Lancets lancets, USE TO CHECK BLOOD SUGAR DAILY AND PRN, Disp: 100 each, Rfl: 4 .  amphetamine-dextroamphetamine (ADDERALL) 30 MG tablet, Take 30 mg by  mouth daily. 1 capsule every day, Disp: , Rfl:  .  atorvastatin (LIPITOR) 20 MG tablet, Take 1 tablet (20 mg total) by mouth daily., Disp: 90 tablet, Rfl: 3 .  Blood Glucose Monitoring Suppl (ACCU-CHEK GUIDE ME) w/Device KIT, USE TO CHECK BLOOD SUGARS DAILY AND PRN, Disp: 1 kit, Rfl: 0 .  cyclobenzaprine (FLEXERIL) 5 MG tablet, Take 1 tablet (5 mg total) by mouth 3 (three) times daily as needed for muscle spasms., Disp: 30 tablet, Rfl: 1 .  glucose blood (ACCU-CHEK GUIDE) test strip, USE TO CHECK BLOOD SUGAR DAILY AND PRN, Disp: 100 each, Rfl: 4 .  losartan (COZAAR) 25 MG tablet, Take 1 tablet (25 mg total) by mouth daily., Disp: 30 tablet, Rfl: 2 .  metFORMIN (GLUCOPHAGE) 500 MG tablet, Take two tablets in the AM, and one tablet in the PM., Disp: 180 tablet, Rfl: 3 .  mometasone-formoterol (DULERA) 100-5 MCG/ACT AERO, Inhale 2 puffs into the lungs in the morning and at bedtime., Disp: 13 g, Rfl: 0 .  predniSONE (DELTASONE) 20 MG tablet, Take 2 tablets (40 mg total) by mouth daily., Disp: 10 tablet, Rfl: 0 .  traZODone (DESYREL) 50 MG tablet, Take 0.5-1 tablets (25-50 mg total) by mouth at bedtime as needed for sleep., Disp: 30 tablet, Rfl: 3  EXAM:  VITALS per patient if applicable:  GENERAL: alert, oriented, appears well and in no acute distress  HEENT: atraumatic, conjunttiva clear, no obvious abnormalities on inspection of external nose and ears  NECK: normal movements of the head and neck  LUNGS: on inspection no signs of respiratory distress, breathing rate appears normal,  no obvious gross SOB, gasping or wheezing  CV: no obvious cyanosis  GU: not examined due to video visit  MS: moves all visible extremities without noticeable abnormality  PSYCH/NEURO: pleasant and cooperative, no obvious depression or anxiety, speech and thought processing grossly intact  ASSESSMENT AND PLAN:  Discussed the following assessment and plan:  Vaginitis and vulvovaginitis  -we discussed  possible serious and likely etiologies, options for evaluation and workup, limitations of telemedicine visit vs in person visit, treatment, treatment risks and precautions. Pt prefers to treat via telemedicine empirically rather than in person at this moment. She feels strongly that diflucan will help. Discussed at lenght other causes of vulvovaginal pruritis and dietary changes for sugar and yeast control. Advised inperson visit in the next 1-2 weeks with PCP or gyn to do exam. She agrees to arrange this. In interim sent rx she requested.  Advised to seek prompt in person care in interim if worsening, new symptoms arise, or if is not improving with treatment. Discussed options for inperson care if PCP office not available. Did let this patient know that I only do telemedicine on Tuesdays and Thursdays for [facility]. Advised to schedule follow up visit with PCP or [facility] if any further questions or concerns to avoid delays in care.   I discussed the assessment and treatment plan with the patient. The patient was provided an opportunity to ask questions and all were answered. The patient agreed with the plan and demonstrated an understanding of the instructions.     Lucretia Kern, DO

## 2020-12-13 DIAGNOSIS — N3001 Acute cystitis with hematuria: Secondary | ICD-10-CM | POA: Diagnosis not present

## 2020-12-13 DIAGNOSIS — R319 Hematuria, unspecified: Secondary | ICD-10-CM | POA: Diagnosis not present

## 2020-12-13 DIAGNOSIS — R3915 Urgency of urination: Secondary | ICD-10-CM | POA: Diagnosis not present

## 2021-01-18 DIAGNOSIS — F902 Attention-deficit hyperactivity disorder, combined type: Secondary | ICD-10-CM | POA: Diagnosis not present

## 2021-01-18 DIAGNOSIS — Z79899 Other long term (current) drug therapy: Secondary | ICD-10-CM | POA: Diagnosis not present

## 2021-01-20 DIAGNOSIS — N3091 Cystitis, unspecified with hematuria: Secondary | ICD-10-CM | POA: Diagnosis not present

## 2021-01-20 DIAGNOSIS — R300 Dysuria: Secondary | ICD-10-CM | POA: Diagnosis not present

## 2021-01-20 DIAGNOSIS — R350 Frequency of micturition: Secondary | ICD-10-CM | POA: Diagnosis not present

## 2021-03-17 IMAGING — MG DIGITAL DIAGNOSTIC BILAT W/ TOMO W/ CAD
6 of 10 series · 6 of 30 positions shown · non-contrast
Comparison: June 30, 2018

CLINICAL DATA: 43-year-old patient palpates a BB sized lump in the
right axilla. She has not noticed any skin inflammation.

EXAM:
DIGITAL DIAGNOSTIC BILATERAL MAMMOGRAM WITH CAD AND TOMO
ULTRASOUND RIGHT AXILLA

[R MLO synth-2D]
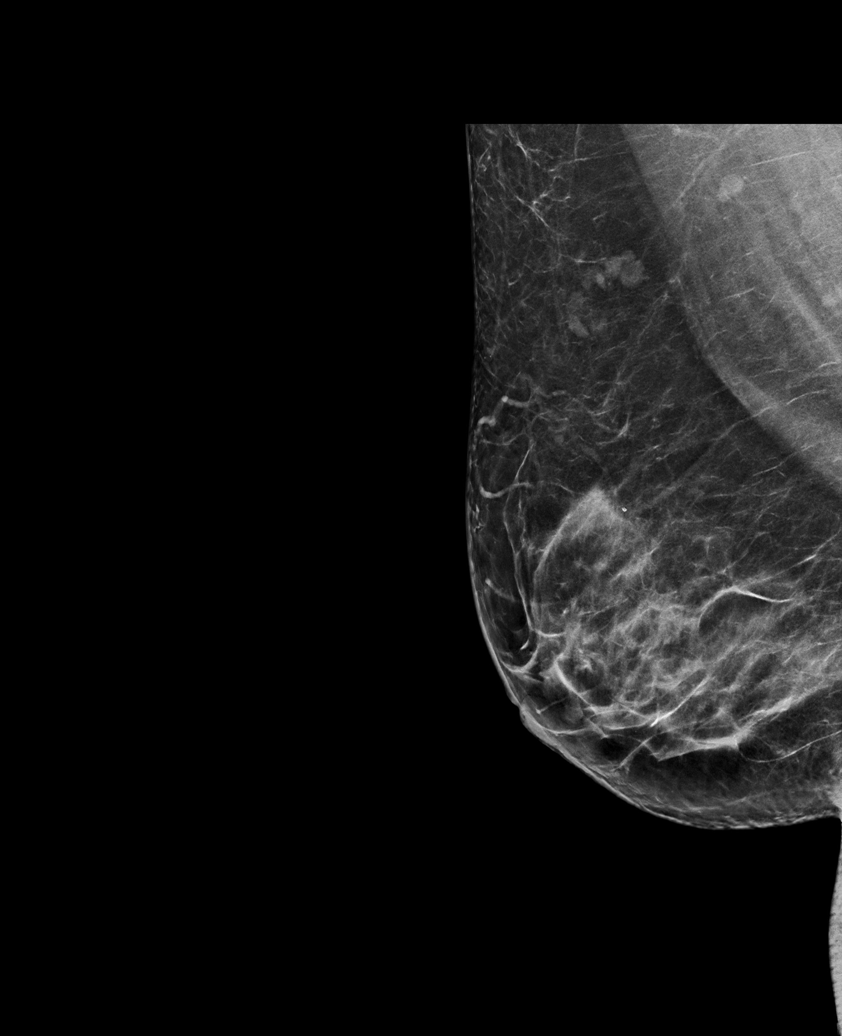

[L MLO synth-2D]
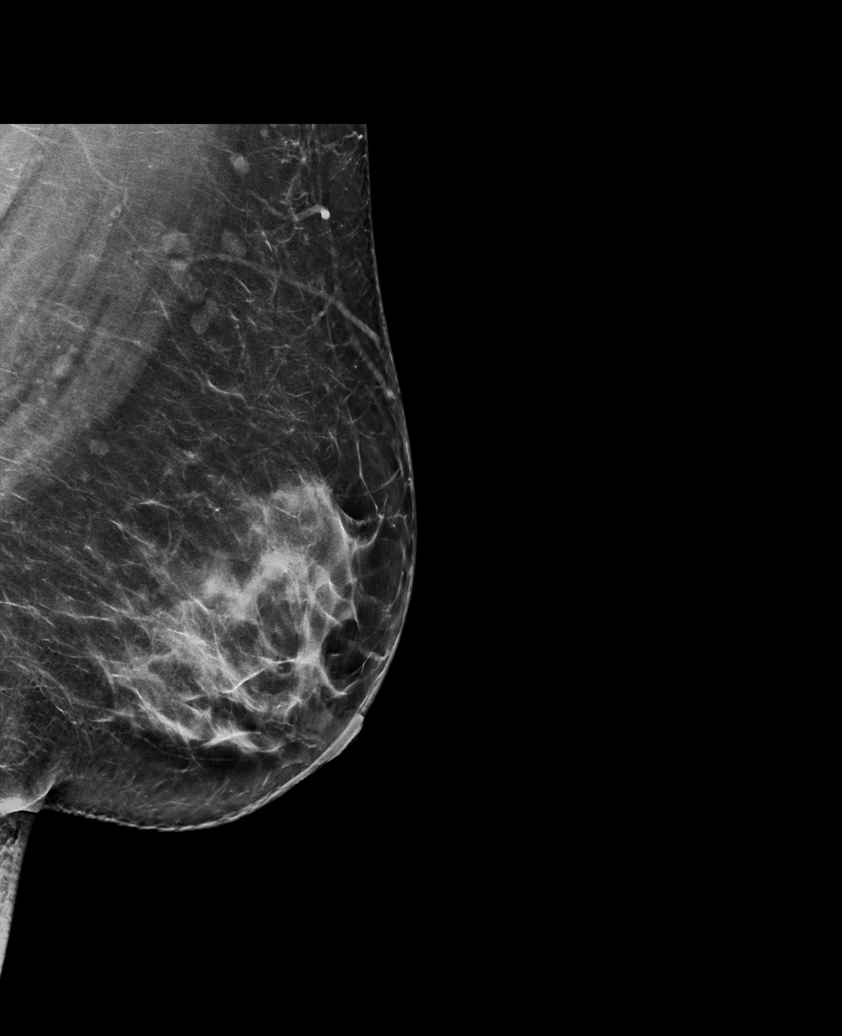

[L CC synth-2D]
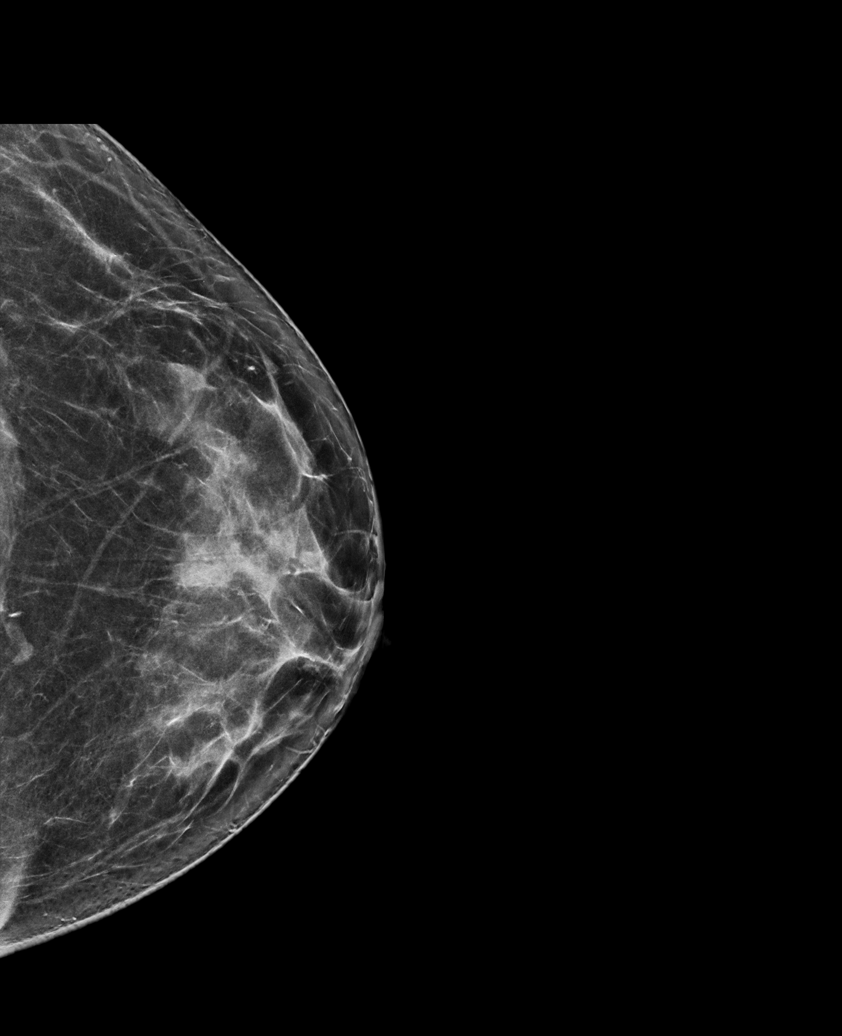

[R TAN synth-2D]
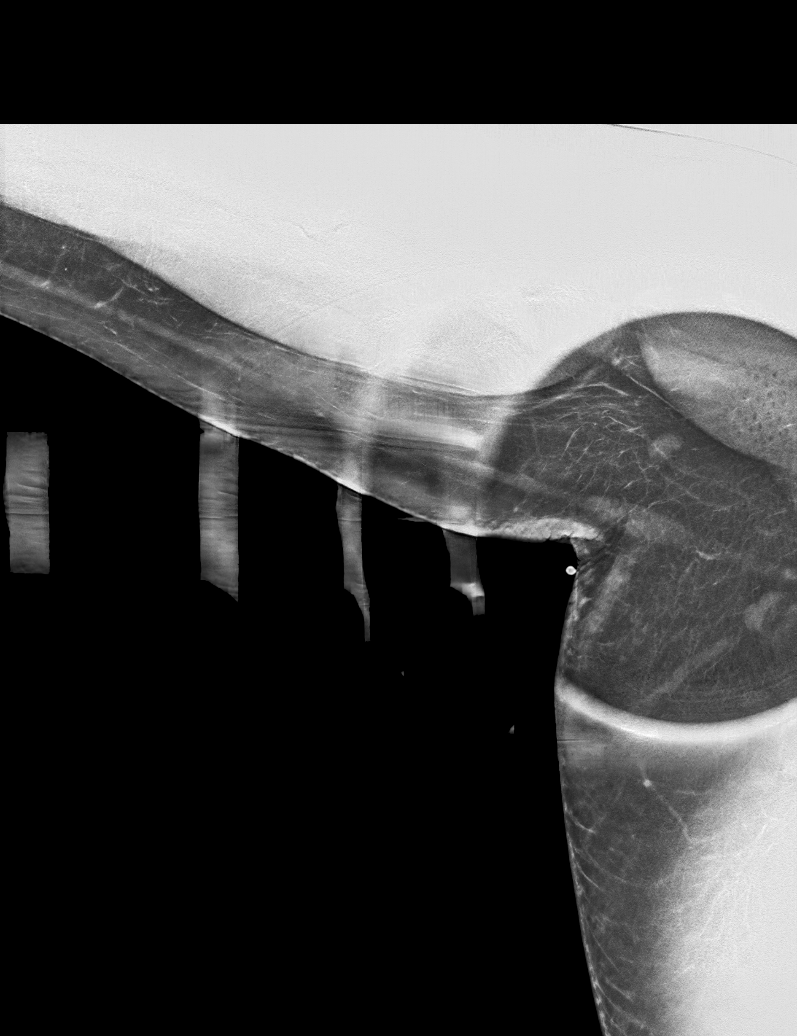

[R CC synth-2D]
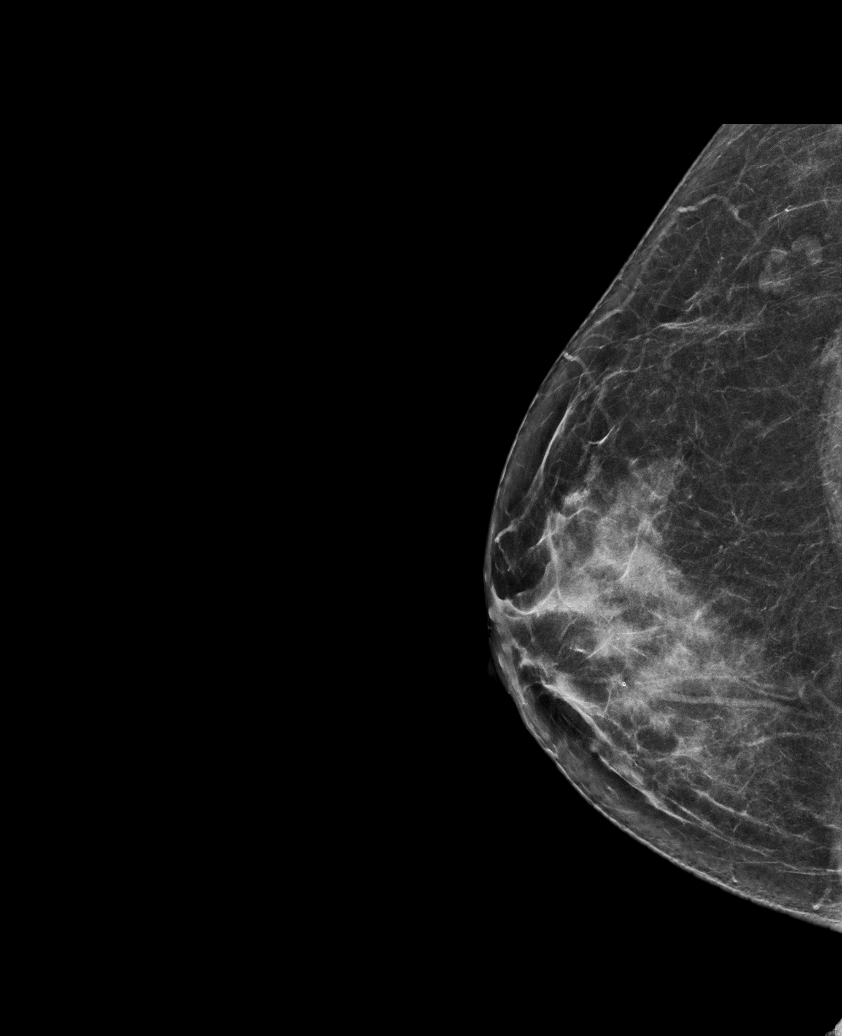

[R TAN tomo · tomo slice 34/67.0]
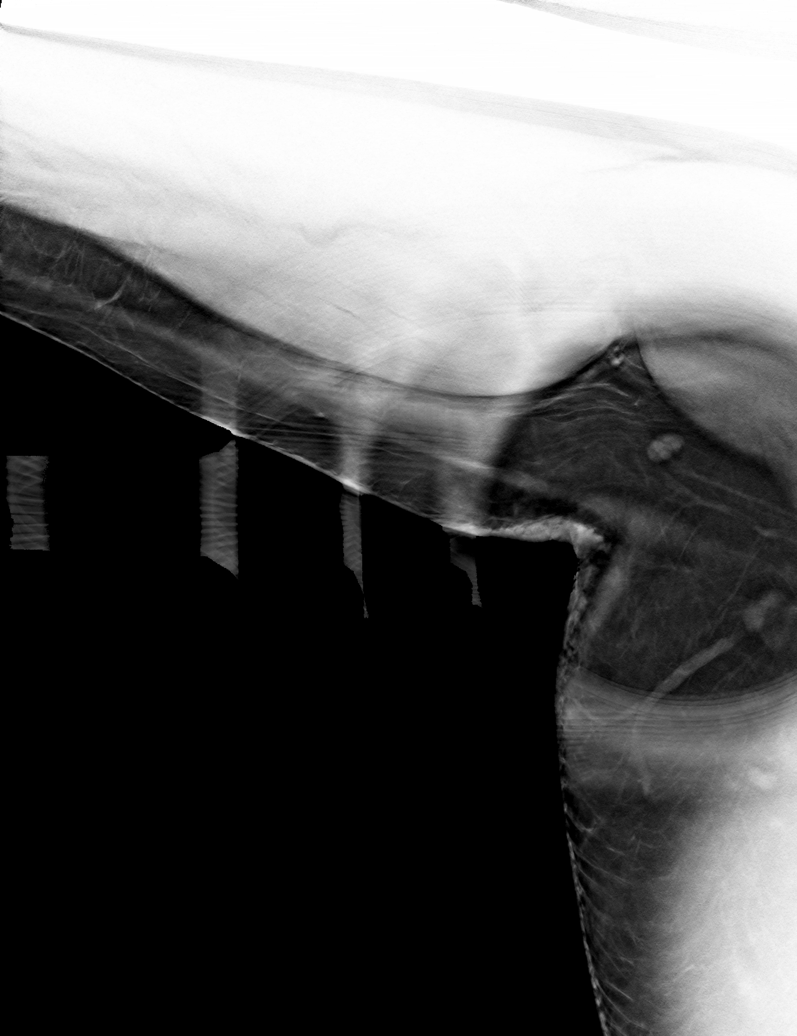

[6 of 30 positions shown; findings below may reference images not displayed]

ACR Breast Density Category c: The breast tissue is heterogeneously
dense, which may obscure small masses.
FINDINGS: No mass, architectural distortion, or suspicious microcalcification
is identified to suggest malignancy in either breast.

Mammographic images were processed with CAD.

On physical exam, I palpate a smooth 3 mm superficial mass in the
mid right axilla, lateral to midline.

Targeted ultrasound is performed, showing mildly complicated cyst in
the dermis of the right axilla. This measures 4 x 2 x 3 mm. Findings
suggest a sebaceous cyst or epidermal inclusion cyst. Negative for
right axillary lymphadenopathy.
IMPRESSION: 1. No mammographic evidence of malignancy in either breast.
2. Palpable dermal complicated cyst in the right axilla. Imaging
findings suggest a benign sebaceous cyst or benign epidermal
inclusion cyst.

RECOMMENDATION:
Screening mammogram in one year.(Code:[id number]-[id number])

I have discussed the findings and recommendations with the patient.
If applicable, a reminder letter will be sent to the patient
regarding the next appointment.

BI-RADS CATEGORY  2: Benign.

## 2021-04-24 DIAGNOSIS — F902 Attention-deficit hyperactivity disorder, combined type: Secondary | ICD-10-CM | POA: Diagnosis not present

## 2021-04-24 DIAGNOSIS — Z79899 Other long term (current) drug therapy: Secondary | ICD-10-CM | POA: Diagnosis not present

## 2022-03-26 ENCOUNTER — Encounter: Payer: Self-pay | Admitting: *Deleted

## 2022-06-14 ENCOUNTER — Encounter: Payer: Self-pay | Admitting: *Deleted

## 2024-02-24 ENCOUNTER — Other Ambulatory Visit: Payer: Self-pay
